# Patient Record
Sex: Female | Born: 1985 | Race: White | Hispanic: No | Marital: Single | Smoking: Current every day smoker
Health system: Southern US, Community
[De-identification: ages and names within clinical notes are randomized; demographics above are authoritative.]

## PROBLEM LIST (undated history)

## (undated) DIAGNOSIS — D891 Cryoglobulinemia: Secondary | ICD-10-CM

## (undated) DIAGNOSIS — F909 Attention-deficit hyperactivity disorder, unspecified type: Secondary | ICD-10-CM

## (undated) DIAGNOSIS — F32A Depression, unspecified: Secondary | ICD-10-CM

## (undated) DIAGNOSIS — J45909 Unspecified asthma, uncomplicated: Secondary | ICD-10-CM

## (undated) DIAGNOSIS — M069 Rheumatoid arthritis, unspecified: Secondary | ICD-10-CM

## (undated) DIAGNOSIS — F419 Anxiety disorder, unspecified: Secondary | ICD-10-CM

## (undated) HISTORY — DX: Depression, unspecified: F32.A

## (undated) HISTORY — DX: Unspecified asthma, uncomplicated: J45.909

## (undated) HISTORY — DX: Cryoglobulinemia: D89.1

## (undated) HISTORY — DX: Anxiety disorder, unspecified: F41.9

## (undated) HISTORY — DX: Rheumatoid arthritis, unspecified: M06.9

## (undated) HISTORY — DX: Attention-deficit hyperactivity disorder, unspecified type: F90.9

## (undated) HISTORY — PX: TUBAL LIGATION: SHX77

---

## 2020-08-21 ENCOUNTER — Other Ambulatory Visit: Payer: Self-pay

## 2020-08-21 DIAGNOSIS — N6320 Unspecified lump in the left breast, unspecified quadrant: Secondary | ICD-10-CM

## 2020-09-05 ENCOUNTER — Ambulatory Visit
Admission: RE | Admit: 2020-09-05 | Discharge: 2020-09-05 | Disposition: A | Discharge status: Home / Self Care | Payer: No Typology Code available for payment source | Source: Ambulatory Visit | Attending: Obstetrics and Gynecology | Admitting: Obstetrics and Gynecology

## 2020-09-05 ENCOUNTER — Ambulatory Visit: Payer: Medicaid Other | Admitting: *Deleted

## 2020-09-05 ENCOUNTER — Ambulatory Visit: Payer: Self-pay

## 2020-09-05 ENCOUNTER — Other Ambulatory Visit: Payer: Self-pay

## 2020-09-05 VITALS — BP 140/98 | Wt 200.1 lb

## 2020-09-05 DIAGNOSIS — Z1239 Encounter for other screening for malignant neoplasm of breast: Secondary | ICD-10-CM

## 2020-09-05 DIAGNOSIS — R87612 Low grade squamous intraepithelial lesion on cytologic smear of cervix (LGSIL): Secondary | ICD-10-CM

## 2020-09-05 DIAGNOSIS — N6320 Unspecified lump in the left breast, unspecified quadrant: Secondary | ICD-10-CM

## 2020-09-05 NOTE — Progress Notes (Signed)
Ms. Teffany Blaszczyk is a 34 y.o. female who presents to [facility] clinic today with complaint of left breast mass x 11 months that is painful. Patient states the pain comes and goes. Patient rates the pain at a 7 out of 10.    Pap Smear: Pap smear not completed today. Last Pap smear was 07/27/2020 at [facility] clinic in [place] and was LSIL with positive HPV. Patient referred for a colposcopy to follow-up for abnormal Pap smear. Patient stated she will have insurance after September 30, 2020. Patient stated she has an appointment scheduled for her colposcopy at [facility] in [place] on 10/25/2020 for follow-up. Per patient has history of three other abnormal Pap smears in 2004, 2005, and 2012 that no follow-up was completed. Last Pap smear result is available in Epic.   Physical exam: Breasts Breasts symmetrical. No skin abnormalities bilateral breasts. No nipple retraction bilateral breasts. No nipple discharge bilateral breasts. No lymphadenopathy. No lumps palpated right breast. Palpated a lump within the left breast at 1:30 o'clock 7 cm from the nipple. Complaints of pain when palpated left breast lump.   Pelvic/Bimanual Pap is not indicated today per BCCCP guidelines.   Smoking History: Patient is a current smoker. Discussed smoking cessation with patient. Referred to the [facility] Quitline and gave resources to the free smoking cessation classes at [facility].   Patient Navigation: Patient education provided. Access to services provided for patient through BCCCP program.    Breast and Cervical Cancer Risk Assessment: Patient has family history of maternal grandmother, a maternal aunt, and maternal great aunt having breast cancer. Patient stated he mom has recently had an abnormal mammogram that needs additional follow-up. Patient has no known genetic mutations or history of radiation treatment to the chest before age 3. Per patient has history of cervical dysplasia. Patient has  no history of being immunocompromised or DES exposure in-utero. Breast cancer risk assessment completed. No breast cancer risk calculated due to patient is less than 28 years old.  Risk Assessment    Risk Scores      09/05/2020   Last edited by: Narda Rutherford, LPN   5-year risk:    Lifetime risk:          A: BCCCP exam without pap smear Complaint of left breast lump and pain.  P: Referred patient to the [facility] for a diagnostic mammogram. Appointment scheduled Tuesday, September 05, 2020 at 1010.  Priscille Heidelberg, RN 09/05/2020 8:26 AM

## 2020-09-05 NOTE — Patient Instructions (Addendum)
Explained breast self awareness with Zaylynn Crigger. Patient did not need a Pap smear today due to last Pap smear was 07/27/2020. Explained the colposcopy the recommended follow-up for her abnormal Pap smear and the importance of following up. Patient referred for a colposcopy to follow-up for her abnormal Pap smear. Patient stated she will have insurance after September 30, 2020. Patient stated she has an appointment scheduled for her colposcopy at [facility] in [place] on 10/25/2020 for follow-up. Informed patient that if anything changes with her insurance that the colposcopy is covered by [facility] and to give [place] a call. Referred patient to the [facility] for a diagnostic mammogram. Appointment scheduled Tuesday, September 05, 2020 at 1010. Patient aware of appointments and will be there. Discussed smoking cessation with patient. Referred to the [facility] Quitline and gave resources to the free smoking cessation classes at [facility]. Chenel Wichert verbalized understanding.  Lucresha Dismuke, Kathaleen Maser, RN 8:26 AM

## 2020-10-03 ENCOUNTER — Ambulatory Visit
Admission: RE | Admit: 2020-10-03 | Discharge: 2020-10-03 | Disposition: A | Discharge status: Home / Self Care | Payer: 59 | Source: Ambulatory Visit | Attending: Obstetrics and Gynecology | Admitting: Obstetrics and Gynecology

## 2020-10-03 ENCOUNTER — Ambulatory Visit
Admission: RE | Admit: 2020-10-03 | Discharge: 2020-10-03 | Disposition: A | Discharge status: Home / Self Care | Payer: No Typology Code available for payment source | Source: Ambulatory Visit | Attending: Obstetrics and Gynecology | Admitting: Obstetrics and Gynecology

## 2020-10-03 ENCOUNTER — Other Ambulatory Visit: Payer: Self-pay

## 2020-10-03 ENCOUNTER — Other Ambulatory Visit: Payer: Self-pay | Admitting: Obstetrics and Gynecology

## 2020-10-03 DIAGNOSIS — N6320 Unspecified lump in the left breast, unspecified quadrant: Secondary | ICD-10-CM

## 2020-11-01 ENCOUNTER — Other Ambulatory Visit: Payer: Self-pay

## 2020-11-01 ENCOUNTER — Ambulatory Visit: Payer: No Typology Code available for payment source | Admitting: Nurse Practitioner

## 2020-11-01 ENCOUNTER — Ambulatory Visit (INDEPENDENT_AMBULATORY_CARE_PROVIDER_SITE_OTHER): Payer: 59 | Admitting: Nurse Practitioner

## 2020-11-01 ENCOUNTER — Encounter: Payer: Self-pay | Admitting: Nurse Practitioner

## 2020-11-01 VITALS — BP 158/94 | HR 88 | Temp 97.7°F | Ht 64.0 in | Wt 203.0 lb

## 2020-11-01 DIAGNOSIS — Z7689 Persons encountering health services in other specified circumstances: Secondary | ICD-10-CM

## 2020-11-01 DIAGNOSIS — F900 Attention-deficit hyperactivity disorder, predominantly inattentive type: Secondary | ICD-10-CM

## 2020-11-01 DIAGNOSIS — F418 Other specified anxiety disorders: Secondary | ICD-10-CM

## 2020-11-01 DIAGNOSIS — F17219 Nicotine dependence, cigarettes, with unspecified nicotine-induced disorders: Secondary | ICD-10-CM

## 2020-11-01 DIAGNOSIS — R5383 Other fatigue: Secondary | ICD-10-CM

## 2020-11-01 DIAGNOSIS — F419 Anxiety disorder, unspecified: Secondary | ICD-10-CM

## 2020-11-01 DIAGNOSIS — R030 Elevated blood-pressure reading, without diagnosis of hypertension: Secondary | ICD-10-CM

## 2020-11-01 DIAGNOSIS — J4520 Mild intermittent asthma, uncomplicated: Secondary | ICD-10-CM

## 2020-11-01 MED ORDER — HYDROXYZINE HCL 25 MG PO TABS: 25.0000 mg | ORAL_TABLET | Freq: Three times a day (TID) | ORAL | 0 refills | Status: DC | PRN

## 2020-11-01 NOTE — Progress Notes (Deleted)
New Patient Office Visit  Subjective:  Patient ID: Julia Humphrey, female    DOB: 1986/08/15  Age: 35 y.o. MRN: [id number]  CC:  Chief Complaint  Patient presents with  . New to establish    HPI Julia Humphrey presents for   Past Medical History:  Diagnosis Date  . ADHD   . Anxiety   . Asthma   . Depression   . Rheumatoid arthritis [facility])     Past Surgical History:  Procedure Laterality Date  . TUBAL LIGATION      Family History  Problem Relation Age of Onset  . Rheum arthritis Mother   . Hypertension Mother   . Brain cancer Father   . Hypertension Sister   . Breast cancer Maternal Grandmother     Social History   Socioeconomic History  . Marital status: Single    Spouse name: Not on file  . Number of children: 3  . Years of education: Not on file  . Highest education level: High school graduate  Occupational History  . Not on file  Tobacco Use  . Smoking status: Current Every Day Smoker    Packs/day: 0.50  . Smokeless tobacco: Never Used  Vaping Use  . Vaping Use: Never used  Substance and Sexual Activity  . Alcohol use: Yes    Comment: occasional  . 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  . Sexual activity: Not Currently    Birth control/protection: Pill  Other Topics Concern  . Not on file  Social History Narrative  . Not on file   Social Determinants of Health   Financial Resource Strain: Not on file  Food Insecurity: Not on file  Transportation Needs: No Transportation Needs  . Lack of Transportation (Medical): No  . Lack of Transportation (Non-Medical): No  Physical Activity: Not on file  Stress: Not on file  Social Connections: Not on file  Intimate Partner Violence: Not on file    ROS Review of Systems  Objective:   Today's Vitals: BP (!) 158/94 (BP Location: Right Arm, Patient Position: Sitting)   Pulse 88   Temp 97.7 F (36.5 C) (Temporal)   Ht 5\' 4"  (1.626  m)   Wt 203 lb (92.1 kg)   SpO2 100%   BMI 34.84 kg/m   Physical Exam  Assessment & Plan:   Problem List Items Addressed This Visit   None   Visit Diagnoses    Encounter to establish care with new doctor    -  Primary   Relevant Orders   CBC with Differential/Platelet   Comprehensive metabolic panel   TSH   Attention deficit hyperactivity disorder (ADHD), predominantly inattentive type       Cigarette nicotine dependence with nicotine-induced disorder       Fatigue, unspecified type       Relevant Orders   Vitamin D, 25-hydroxy      Outpatient Encounter Medications as of 11/01/2020  Medication Sig  . albuterol (VENTOLIN HFA) 108 (90 Base) MCG/ACT inhaler Inhale 1-2 puffs into the lungs every 6 (six) hours as needed for wheezing or shortness of breath.  . amphetamine-dextroamphetamine (ADDERALL) 30 MG tablet Take 30 mg by mouth 2 (two) times daily.  12/30/2020 escitalopram (LEXAPRO) 10 MG tablet Take 10 mg by mouth daily.  Marland Kitchen VITAMIN D PO Take by mouth.  . [DISCONTINUED] norethindrone (MICRONOR) 0.35 MG tablet Take 1 tablet by mouth daily.   No facility-administered encounter  medications on file as of 11/01/2020.    Follow-up: Return if symptoms worsen or fail to improve.   Janie Morning, NP

## 2020-11-01 NOTE — Progress Notes (Signed)
New Patient Office Visit  Subjective:  Patient ID: Julia Humphrey, female    DOB: Feb 27, 1986  Age: 35 y.o. MRN: [id number]  CC:  Chief Complaint  Patient presents with  . New to establish    HPI Julia Humphrey is a 35 year old Caucasian female that presents to establish care. She states she has a past medical history of asthma, ADHD, depression with anxiety, and Raynaud's syndrome. RA work-up negative per medical records review. She tells me her mother has RA. Her bp is elevated today, she denies past medical history of hypertension. Julia Humphrey tells me she has not had prescribed medications since moving from [place], [place] to [place] in Sept 2021. She has requested to resume previous medications. She declines COVID-19 or flu vaccines today. Julia Humphrey tells me that last pap smear was 2021.She states it was abnormal, required colposcopy. Julia Humphrey has not had an eye or dental exam in several years.   Asthma Julia Humphrey has a history of asthma since childhood. Current treatment is Albuterol inhaler PRN. She states her asthma symptoms are currently well-controlled. She denies dyspnea, chest tightness, or wheezing. She denies recent exacerbations or hospitalizations related to asthma. She is currently  a 1/2 pack daily cigarette smoker, since age 35.   ADHD Julia Humphrey has a history of ADHD since early adolescence. She tells me that her symptoms include inattentiveness, lacks organizational skills, forgetfulness, and difficulty managing time. She states last treatment of symptoms was Adderall 30 mg BID. Review of medical records confirms She has requested to resume prescription. 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     Depression with anxiety Julia Humphrey has a history of depression with anxiety. Last prescribed treatment was Lexapro 10 mg daily. She states she has not had medication since Sept  2021. She has requested to resume prescription. PHQ-9 score 18 and GAD-7 score 19 today in office. She denies suicidal ideations or thoughts of harming herself.    Past Medical History:  Diagnosis Date  . ADHD   . Anxiety   . Asthma   . Depression   . Rheumatoid arthritis [facility])     Past Surgical History:  Procedure Laterality Date  . TUBAL LIGATION      Family History  Problem Relation Age of Onset  . Rheum arthritis Mother   . Hypertension Mother   . Brain cancer Father   . Hypertension Sister   . Breast cancer Maternal Grandmother     Social History   Socioeconomic History  . Marital status: Single    Spouse name: Not on file  . Number of children: 3  . Years of education: Not on file  . Highest education level: High school graduate  Occupational History  . Not on file  Tobacco Use  . Smoking status: Current Every Day Smoker    Packs/day: 0.50  . Smokeless tobacco: Never Used  Vaping Use  . Vaping Use: Never used  Substance and Sexual Activity  . Alcohol use: Yes    Comment: occasional  . 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  . Sexual activity: Not Currently    Birth control/protection: Pill  Other Topics Concern  . Not on file  Social History Narrative  . Not on file   Social Determinants of Health   Financial Resource Strain: Not on file  Food Insecurity: Not on file  Transportation Needs: No Transportation Needs  . Lack of Transportation (Medical): No  . Lack of Transportation (Non-Medical): No  Physical Activity: Not on file  Stress: Not on file  Social Connections: Not on file  Intimate Partner Violence: Not on file    ROS Review of Systems  Constitutional: Negative for fatigue and fever.  HENT: Negative for congestion, ear pain, sinus pressure and sore throat.   Eyes: Negative for pain.  Respiratory: Negative for cough, chest tightness, shortness of breath and wheezing.    Cardiovascular: Negative for chest pain and palpitations.  Gastrointestinal: Negative for abdominal pain, constipation, diarrhea, nausea and vomiting.  Genitourinary: Negative for dysuria and hematuria.  Musculoskeletal: Negative for arthralgias, back pain, joint swelling and myalgias.  Skin: Negative for rash.  Neurological: Negative for dizziness, weakness and headaches.  Psychiatric/Behavioral: Negative for dysphoric mood. The patient is not nervous/anxious.     Objective:   Today's Vitals: BP (!) 158/94 (BP Location: Right Arm, Patient Position: Sitting)   Pulse 88   Temp 97.7 F (36.5 C) (Temporal)   Ht 5\' 4"  (1.626 m)   Wt 203 lb (92.1 kg)   SpO2 100%   BMI 34.84 kg/m   Physical Exam Constitutional:      Appearance: Normal appearance.  HENT:     Right Ear: Tympanic membrane, ear canal and external ear normal.     Left Ear: Tympanic membrane, ear canal and external ear normal.     Nose: Nose normal.     Mouth/Throat:     Mouth: Mucous membranes are moist.  Cardiovascular:     Rate and Rhythm: Normal rate and regular rhythm.     Pulses: Normal pulses.     Heart sounds: Normal heart sounds.  Pulmonary:     Effort: Pulmonary effort is normal.     Breath sounds: Normal breath sounds.  Abdominal:     Palpations: Abdomen is soft.  Musculoskeletal:        General: Normal range of motion.     Cervical back: Normal range of motion.  Skin:    General: Skin is warm and dry.  Neurological:     Mental Status: She is oriented to person, place, and time.  Psychiatric:        Mood and Affect: Mood normal.        Behavior: Behavior normal.        Thought Content: Thought content normal.        Judgment: Judgment normal.     Assessment & Plan:   1. Encounter to establish care with new doctor - CBC with Differential/Platelet - Comprehensive metabolic panel - TSH  2. Attention deficit hyperactivity disorder (ADHD), predominantly inattentive type -  amphetamine-dextroamphetamine (ADDERALL) 30 MG tablet; Take 1 tablet by mouth 2 (two) times daily.  Dispense: 60 tablet; Refill: 0  3. Cigarette nicotine dependence with nicotine-induced disorder -Smoking cessation encouraged  4. Fatigue, unspecified type - Vitamin D, 25-hydroxy  5. Elevated BP without diagnosis of hypertension -Smoking cessation encouraged -Return in 2 weeks for BP recheck  6. Depression with anxiety-Not currently well controlled -PHQ-9 score 18 in office today -GAD 7 Score 19 in office today - hydrOXYzine (ATARAX/VISTARIL) 25 MG tablet; Take 1 tablet (25 mg total) by mouth 3 (three) times daily as needed.  Dispense: 30 tablet; Refill: 0 - Resume Lexapro 10 mg daily  7. Mild intermittent asthma, unspecified whether complicated -Albuterol inhaler as needed -Smoking cessation encouraged  Outpatient Encounter Medications as of 11/01/2020  Medication Sig  .  albuterol (VENTOLIN HFA) 108 (90 Base) MCG/ACT inhaler Inhale 1-2 puffs into the lungs every 6 (six) hours as needed for wheezing or shortness of breath.  . amphetamine-dextroamphetamine (ADDERALL) 30 MG tablet Take 30 mg by mouth 2 (two) times daily.  Marland Kitchen escitalopram (LEXAPRO) 10 MG tablet Take 10 mg by mouth daily.  Marland Kitchen VITAMIN D PO Take by mouth.  . [DISCONTINUED] norethindrone (MICRONOR) 0.35 MG tablet Take 1 tablet by mouth daily.   No facility-administered encounter medications on file as of 11/01/2020.   We will call you with lab results Resume Atarax 25 mg as needed for anxiety Resume Lexapro 10 mg  Return in 2-weeks for BP check and pap smear    Follow-up: 2-weeks for BP recheck  Flonnie Hailstone, DNP

## 2020-11-01 NOTE — Patient Instructions (Addendum)
We will call you with lab results Resume Atarax 25 mg as needed for anxiety Resume Lexapro 10 mg  Return in 2-weeks for BP check and pap smear     [email or web address]  Generalized Anxiety Disorder, Adult Generalized anxiety disorder (GAD) is a mental health condition. Unlike normal worries, anxiety related to GAD is not triggered by a specific event. These worries do not fade or get better with time. GAD interferes with relationships, work, and school. GAD symptoms can vary from mild to severe. People with severe GAD can have intense waves of anxiety with physical symptoms that are similar to panic attacks. What are the causes? The exact cause of GAD is not known, but the following are believed to have an impact:  Differences in natural brain chemicals.  Genes passed down from parents to children.  Differences in the way threats are perceived.  Development during childhood.  Personality. What increases the risk? The following factors may make you more likely to develop this condition:  Being female.  Having a family history of anxiety disorders.  Being very shy.  Experiencing very stressful life events, such as the death of a loved one.  Having a very stressful family environment. What are the signs or symptoms? People with GAD often worry excessively about many things in their lives, such as their health and family. Symptoms may also include:  Mental and emotional symptoms: ? Worrying excessively about natural disasters. ? Fear of being late. ? Difficulty concentrating. ? Fears that others are judging your performance.  Physical symptoms: ? Fatigue. ? Headaches, muscle tension, muscle twitches, trembling, or feeling shaky. ? Feeling like your heart is pounding or beating very fast. ? Feeling out of breath or like you cannot take a deep breath. ? Having trouble falling asleep or staying asleep, or experiencing restlessness. ? Sweating. ? Nausea, diarrhea, or  irritable bowel syndrome (IBS).  Behavioral symptoms: ? Experiencing erratic moods or irritability. ? Avoidance of new situations. ? Avoidance of people. ? Extreme difficulty making decisions. How is this diagnosed? This condition is diagnosed based on your symptoms and medical history. You will also have a physical exam. Your health care provider may perform tests to rule out other possible causes of your symptoms. To be diagnosed with GAD, a person must have anxiety that:  Is out of his or her control.  Affects several different aspects of his or her life, such as work and relationships.  Causes distress that makes him or her unable to take part in normal activities.  Includes at least three symptoms of GAD, such as restlessness, fatigue, trouble concentrating, irritability, muscle tension, or sleep problems. Before your health care provider can confirm a diagnosis of GAD, these symptoms must be present more days than they are not, and they must last for 6 months or longer. How is this treated? This condition may be treated with:  Medicine. Antidepressant medicine is usually prescribed for long-term daily control. Anti-anxiety medicines may be added in severe cases, especially when panic attacks occur.  Talk therapy (psychotherapy). Certain types of talk therapy can be helpful in treating GAD by providing support, education, and guidance. Options include: ? Cognitive behavioral therapy (CBT). People learn coping skills and self-calming techniques to ease their physical symptoms. They learn to identify unrealistic thoughts and behaviors and to replace them with more appropriate thoughts and behaviors. ? Acceptance and commitment therapy (ACT). This treatment teaches people how to be mindful as a way to cope with unwanted thoughts and feelings. ?  Biofeedback. This process trains you to manage your body's response (physiological response) through breathing techniques and relaxation methods.  You will work with a therapist while machines are used to monitor your physical symptoms.  Stress management techniques. These include yoga, meditation, and exercise. A mental health specialist can help determine which treatment is best for you. Some people see improvement with one type of therapy. However, other people require a combination of therapies.   Follow these instructions at home: Lifestyle  Maintain a consistent routine and schedule.  Anticipate stressful situations. Create a plan, and allow extra time to work with your plan.  Practice stress management or self-calming techniques that you have learned from your therapist or your health care provider. General instructions  Take over-the-counter and prescription medicines only as told by your health care provider.  Understand that you are likely to have setbacks. Accept this and be kind to yourself as you persist to take better care of yourself.  Recognize and accept your accomplishments, even if you judge them as small.  Keep all follow-up visits as told by your health care provider. This is important. Contact a health care provider if:  Your symptoms do not get better.  Your symptoms get worse.  You have signs of depression, such as: ? A persistently sad or irritable mood. ? Loss of enjoyment in activities that used to bring you joy. ? Change in weight or eating. ? Changes in sleeping habits. ? Avoiding friends or family members. ? Loss of energy for normal tasks. ? Feelings of guilt or worthlessness. Get help right away if:  You have serious thoughts about hurting yourself or others. If you ever feel like you may hurt yourself or others, or have thoughts about taking your own life, get help right away. Go to your nearest emergency department or:  Call your local emergency services (911 in the [place]  Call a suicide crisis helpline, such as the [facility] at 229-267-7914. This is open  24 hours a day in the [place]  Text the Crisis Text Line at 5872014779 (in the [place].). Summary  Generalized anxiety disorder (GAD) is a mental health condition that involves worry that is not triggered by a specific event.  People with GAD often worry excessively about many things in their lives, such as their health and family.  GAD may cause symptoms such as restlessness, trouble concentrating, sleep problems, frequent sweating, nausea, diarrhea, headaches, and trembling or muscle twitching.  A mental health specialist can help determine which treatment is best for you. Some people see improvement with one type of therapy. However, other people require a combination of therapies. This information is not intended to replace advice given to you by your health care provider. Make sure you discuss any questions you have with your health care provider. Document Revised: 07/07/2019 Document Reviewed: 07/07/2019 Elsevier Patient Education  [place] 76-22 Years Old, Female Preventive care refers to lifestyle choices and visits with your health care provider that can promote health and wellness. This includes:  A yearly physical exam. This is also called an annual wellness visit.  Regular dental and eye exams.  Immunizations.  Screening for certain conditions.  Healthy lifestyle choices, such as: ? Eating a healthy diet. ? Getting regular exercise. ? Not using drugs or products that contain nicotine and tobacco. ? Limiting alcohol use. What can I expect for my preventive care visit? Physical exam Your health care provider may check your:  Height and weight. These may be used to calculate your BMI (body mass index). BMI is a measurement that tells if you are at a healthy weight.  Heart rate and blood pressure.  Body temperature.  Skin for abnormal spots. Counseling Your health care provider may ask you questions about your:  Past medical  problems.  Family's medical history.  Alcohol, tobacco, and drug use.  Emotional well-being.  Home life and relationship well-being.  Sexual activity.  Diet, exercise, and sleep habits.  Work and work Statistician.  Access to firearms.  Method of birth control.  Menstrual cycle.  Pregnancy history. What immunizations do I need? Vaccines are usually given at various ages, according to a schedule. Your health care provider will recommend vaccines for you based on your age, medical history, and lifestyle or other factors, such as travel or where you work.   What tests do I need? Blood tests  Lipid and cholesterol levels. These may be checked every 5 years starting at age 63.  Hepatitis C test.  Hepatitis B test. Screening  Diabetes screening. This is done by checking your blood sugar (glucose) after you have not eaten for a while (fasting).  STD (sexually transmitted disease) testing, if you are at risk.  BRCA-related cancer screening. This may be done if you have a family history of breast, ovarian, tubal, or peritoneal cancers.  Pelvic exam and Pap test. This may be done every 3 years starting at age 77. Starting at age 51, this may be done every 5 years if you have a Pap test in combination with an HPV test. Talk with your health care provider about your test results, treatment options, and if necessary, the need for more tests.   Follow these instructions at home: Eating and drinking  Eat a healthy diet that includes fresh fruits and vegetables, whole grains, lean protein, and low-fat dairy products.  Take vitamin and mineral supplements as recommended by your health care provider.  Do not drink alcohol if: ? Your health care provider tells you not to drink. ? You are pregnant, may be pregnant, or are planning to become pregnant.  If you drink alcohol: ? Limit how much you have to 0-1 drink a day. ? Be aware of how much alcohol is in your drink. In the [place]  one drink equals one 12 oz bottle of beer (355 mL), one 5 oz glass of wine (148 mL), or one 1 oz glass of hard liquor (44 mL).   Lifestyle  Take daily care of your teeth and gums. Brush your teeth every morning and night with fluoride toothpaste. Floss one time each day.  Stay active. Exercise for at least 30 minutes 5 or more days each week.  Do not use any products that contain nicotine or tobacco, such as cigarettes, e-cigarettes, and chewing tobacco. If you need help quitting, ask your health care provider.  Do not use drugs.  If you are sexually active, practice safe sex. Use a condom or other form of protection to prevent STIs (sexually transmitted infections).  If you do not wish to become pregnant, use a form of birth control. If you plan to become pregnant, see your health care provider for a prepregnancy visit.  Find healthy ways to cope with stress, such as: ? Meditation, yoga, or listening to music. ? Journaling. ? Talking to a trusted person. ? Spending time with friends and family. Safety  Always wear your seat belt while driving or riding in a vehicle.  Do not drive: ? If you have been drinking alcohol. Do not ride with someone who has been drinking. ? When you are tired or distracted. ? While texting.  Wear a helmet and other protective equipment during sports activities.  If you have firearms in your house, make sure you follow all gun safety procedures.  Seek help if you have been physically or sexually abused. What's next?  Go to your health care provider once a year for an annual wellness visit.  Ask your health care provider how often you should have your eyes and teeth checked.  Stay up to date on all vaccines. This information is not intended to replace advice given to you by your health care provider. Make sure you discuss any questions you have with your health care provider. Document Revised: 05/14/2020 Document Reviewed: 05/28/2018 Elsevier  Patient Education  2021 [place].  Major Depressive Disorder, Adult Major depressive disorder is a mental health condition. This disorder affects feelings. It can also affect the body. Symptoms of this condition last most of the day, almost every day, for 2 weeks. This disorder can affect:  Relationships.  Daily activities, such as work and school.  Activities that you normally like to do. What are the causes? The cause of this condition is not known. The disorder is likely caused by a mix of things, including:  Your personality, such as being a shy person.  Your behavior, or how you act toward others.  Your thoughts and feelings.  Too much alcohol or drugs.  How you react to stress.  Health and mental problems that you have had for a long time.  Things that hurt you in the past (trauma).  Big changes in your life, such as divorce. What increases the risk? The following factors may make you more likely to develop this condition:  Having family members with depression.  Being a woman.  Problems in the family.  Low levels of some brain chemicals.  Things that caused you pain as a child, especially if you lost a parent or were abused.  A lot of stress in your life, such as from: ? Living without basic needs of life, such as food and shelter. ? Being treated poorly because of race, sex, or religion (discrimination).  Health and mental problems that you have had for a long time. What are the signs or symptoms? The main symptoms of this condition are:  Being sad all the time.  Being grouchy all the time.  Loss of interest in things and activities. Other symptoms include:  Sleeping too much or too little.  Eating too much or too little.  Gaining or losing weight, without knowing why.  Feeling tired or having low energy.  Being restless and weak.  Feeling hopeless, worthless, or guilty.  Trouble thinking clearly or making decisions.  Thoughts of  hurting yourself or others, or thoughts of ending your life.  Spending a lot of time alone.  Inability to complete common tasks of daily life. If you have very bad MDD, you may:  Believe things that are not true.  Hear, see, taste, or feel things that are not there.  Have mild depression that lasts for at least 2 years.  Feel very sad and hopeless.  Have trouble speaking or moving. How is this treated? This condition may be treated with:  Talk therapy. This teaches you to know bad thoughts, feelings, and actions and how to change them. ? This can also help you to  communicate with others. ? This can be done with members of your family.  Medicines. These can be used to treat worry (anxiety), depression, or low levels of chemicals in the brain.  Lifestyle changes. You may need to: ? Limit alcohol use. ? Limit drug use. ? Get regular exercise. ? Get plenty of sleep. ? Make healthy eating choices. ? Spend more time outdoors.  Brain stimulation. This treatment excites the brain. This is done when symptoms are very bad or have not gotten better with other treatments. Follow these instructions at home: Activity  Get regular exercise as told.  Spend time outdoors as told.  Make time to do the things you enjoy.  Find ways to deal with stress. Try to: ? Meditate. ? Do deep breathing. ? Spend time in nature. ? Keep a journal.  Return to your normal activities as told by your doctor. Ask your doctor what activities are safe for you. Alcohol and drug use  If you drink alcohol: ? Limit how much you use to:  0-1 drink a day for women.  0-2 drinks a day for men. ? Be aware of how much alcohol is in your drink. In the [place] one drink equals one 12 oz bottle of beer (355 mL), one 5 oz glass of wine (148 mL), or one 1 oz glass of hard liquor (44 mL).  Talk to your doctor about: ? Alcohol use. Alcohol can affect some medicines. ? Any drug use. General instructions  Take  over-the-counter and prescription medicines and herbal preparations only as told by your doctor.  Eat a healthy diet.  Get a lot of sleep.  Think about joining a support group. Your doctor may be able to suggest one.  Keep all follow-up visits as told by your doctor. This is important.   Where to find more information:  [employer] on Mental Illness: www.nami.[place]: https://carter.com/  American Psychiatric Association: www.psychiatry.org/patients-families/ Contact a doctor if:  Your symptoms get worse.  You get new symptoms. Get help right away if:  You hurt yourself.  You have serious thoughts about hurting yourself or others.  You see, hear, taste, smell, or feel things that are not there. If you ever feel like you may hurt yourself or others, or have thoughts about taking your own life, get help right away. Go to your nearest emergency department or:  Call your local emergency services (911 in the [place]  Call a suicide crisis helpline, such as the [facility] at (272)568-2948. This is open 24 hours a day in the [place]  Text the Crisis Text Line at 2130447678 (in the [place].). Summary  Major depressive disorder is a mental health condition. This disorder affects feelings. Symptoms of this condition last most of the day, almost every day, for 2 weeks.  The symptoms of this disorder can cause problems with relationships and with daily activities.  There are treatments and support for people who get this disorder. You may need more than one type of treatment.  Get help right away if you have serious thoughts about hurting yourself or others. This information is not intended to replace advice given to you by your health care provider. Make sure you discuss any questions you have with your health care provider. Document Revised: 08/28/2019 Document Reviewed: 08/28/2019 Elsevier Patient Education  2021 [employer].

## 2020-11-02 LAB — COMPREHENSIVE METABOLIC PANEL
ALT: 21 IU/L (ref 0–32)
AST: 19 IU/L (ref 0–40)
Albumin/Globulin Ratio: 1.8 (ref 1.2–2.2)
Albumin: 4.4 g/dL (ref 3.8–4.8)
Alkaline Phosphatase: 58 IU/L (ref 44–121)
BUN/Creatinine Ratio: 23 (ref 9–23)
BUN: 13 mg/dL (ref 6–20)
Bilirubin Total: <0.2 mg/dL (ref 0.0–1.2)
CO2: 24 mmol/L (ref 20–29)
Calcium: 8.5 mg/dL — ABNORMAL LOW (ref 8.7–10.2)
Chloride: 101 mmol/L (ref 96–106)
Creatinine, Ser: 0.57 mg/dL (ref 0.57–1.00)
GFR calc Af Amer: 140 mL/min/{1.73_m2} (ref >59)
GFR calc non Af Amer: 121 mL/min/{1.73_m2} (ref >59)
Globulin, Total: 2.4 g/dL (ref 1.5–4.5)
Glucose: 82 mg/dL (ref 65–99)
Potassium: 4.5 mmol/L (ref 3.5–5.2)
Sodium: 138 mmol/L (ref 134–144)
Total Protein: 6.8 g/dL (ref 6.0–8.5)

## 2020-11-02 LAB — CBC WITH DIFFERENTIAL/PLATELET
Basophils Absolute: 0.1 10*3/uL (ref 0.0–0.2)
Basos: 1 %
EOS (ABSOLUTE): 0.2 10*3/uL (ref 0.0–0.4)
Eos: 3 %
Hematocrit: 40.7 % (ref 34.0–46.6)
Hemoglobin: 13.9 g/dL (ref 11.1–15.9)
Immature Grans (Abs): 0 10*3/uL (ref 0.0–0.1)
Immature Granulocytes: 0 %
Lymphocytes Absolute: 1.8 10*3/uL (ref 0.7–3.1)
Lymphs: 24 %
MCH: 32.4 pg (ref 26.6–33.0)
MCHC: 34.2 g/dL (ref 31.5–35.7)
MCV: 95 fL (ref 79–97)
Monocytes Absolute: 0.5 10*3/uL (ref 0.1–0.9)
Monocytes: 6 %
Neutrophils Absolute: 5 10*3/uL (ref 1.4–7.0)
Neutrophils: 66 %
Platelets: 276 10*3/uL (ref 150–450)
RBC: 4.29 x10E6/uL (ref 3.77–5.28)
RDW: 12.5 % (ref 11.7–15.4)
WBC: 7.5 10*3/uL (ref 3.4–10.8)

## 2020-11-02 LAB — VITAMIN D 25 HYDROXY (VIT D DEFICIENCY, FRACTURES): Vit D, 25-Hydroxy: 29 ng/mL — ABNORMAL LOW (ref 30.0–100.0)

## 2020-11-02 LAB — TSH: TSH: 2.09 u[IU]/mL (ref 0.450–4.500)

## 2020-11-02 MED ORDER — AMPHETAMINE-DEXTROAMPHETAMINE 30 MG PO TABS: 30.0000 mg | ORAL_TABLET | Freq: Two times a day (BID) | ORAL | 0 refills | Status: DC

## 2020-11-04 ENCOUNTER — Other Ambulatory Visit: Payer: Self-pay | Admitting: Nurse Practitioner

## 2020-11-04 DIAGNOSIS — F418 Other specified anxiety disorders: Secondary | ICD-10-CM

## 2020-11-04 MED ORDER — ESCITALOPRAM OXALATE 10 MG PO TABS: 10.0000 mg | ORAL_TABLET | Freq: Every day | ORAL | 0 refills | Status: DC

## 2020-11-05 ENCOUNTER — Encounter: Payer: Self-pay | Admitting: Nurse Practitioner

## 2020-11-15 ENCOUNTER — Ambulatory Visit (INDEPENDENT_AMBULATORY_CARE_PROVIDER_SITE_OTHER): Payer: 59

## 2020-11-15 ENCOUNTER — Other Ambulatory Visit: Payer: Self-pay

## 2020-11-15 VITALS — BP 120/78 | HR 82

## 2020-11-15 DIAGNOSIS — R030 Elevated blood-pressure reading, without diagnosis of hypertension: Secondary | ICD-10-CM | POA: Diagnosis not present

## 2020-11-15 NOTE — Progress Notes (Signed)
      Patient came in today for BP check.  Vitals with BMI 11/15/2020 11/01/2020  Systolic 120 158  Diastolic 78 94  Pulse 82 88   Patient states that she is taking all medications as directed.  Creola Corn, LPN 07/86/75 4:49 AM

## 2020-12-01 ENCOUNTER — Other Ambulatory Visit: Payer: Self-pay

## 2020-12-01 DIAGNOSIS — F900 Attention-deficit hyperactivity disorder, predominantly inattentive type: Secondary | ICD-10-CM

## 2020-12-02 MED ORDER — AMPHETAMINE-DEXTROAMPHETAMINE 30 MG PO TABS: 30.0000 mg | ORAL_TABLET | Freq: Two times a day (BID) | ORAL | 0 refills | Status: DC

## 2021-01-02 ENCOUNTER — Other Ambulatory Visit: Payer: Self-pay

## 2021-01-02 DIAGNOSIS — F900 Attention-deficit hyperactivity disorder, predominantly inattentive type: Secondary | ICD-10-CM

## 2021-01-02 DIAGNOSIS — F418 Other specified anxiety disorders: Secondary | ICD-10-CM

## 2021-01-03 MED ORDER — ESCITALOPRAM OXALATE 10 MG PO TABS: 10.0000 mg | ORAL_TABLET | Freq: Every day | ORAL | 0 refills | Status: DC

## 2021-01-03 MED ORDER — HYDROXYZINE HCL 25 MG PO TABS: 25.0000 mg | ORAL_TABLET | Freq: Three times a day (TID) | ORAL | 0 refills | Status: DC | PRN

## 2021-01-03 MED ORDER — AMPHETAMINE-DEXTROAMPHETAMINE 30 MG PO TABS: 30.0000 mg | ORAL_TABLET | Freq: Two times a day (BID) | ORAL | 0 refills | Status: DC

## 2021-01-03 NOTE — Telephone Encounter (Signed)
Needs to switch pharmacy.   Julia Humphrey, CCMA 01/03/21 10:20 AM

## 2021-01-03 NOTE — Addendum Note (Signed)
Addended by: Humberto Leep on: 01/03/2021 10:21 AM   Modules accepted: Orders

## 2021-02-01 ENCOUNTER — Other Ambulatory Visit: Payer: Self-pay

## 2021-02-01 DIAGNOSIS — F900 Attention-deficit hyperactivity disorder, predominantly inattentive type: Secondary | ICD-10-CM

## 2021-02-01 DIAGNOSIS — F418 Other specified anxiety disorders: Secondary | ICD-10-CM

## 2021-02-01 MED ORDER — ESCITALOPRAM OXALATE 10 MG PO TABS: 10.0000 mg | ORAL_TABLET | Freq: Every day | ORAL | 0 refills | Status: DC

## 2021-02-01 MED ORDER — HYDROXYZINE HCL 25 MG PO TABS: 25.0000 mg | ORAL_TABLET | Freq: Three times a day (TID) | ORAL | 0 refills | Status: DC | PRN

## 2021-02-05 ENCOUNTER — Other Ambulatory Visit: Payer: Self-pay

## 2021-02-05 ENCOUNTER — Other Ambulatory Visit: Payer: Self-pay | Admitting: Nurse Practitioner

## 2021-02-05 DIAGNOSIS — F418 Other specified anxiety disorders: Secondary | ICD-10-CM

## 2021-02-05 DIAGNOSIS — F900 Attention-deficit hyperactivity disorder, predominantly inattentive type: Secondary | ICD-10-CM

## 2021-02-05 MED ORDER — ESCITALOPRAM OXALATE 10 MG PO TABS: 10.0000 mg | ORAL_TABLET | Freq: Every day | ORAL | 0 refills | Status: DC

## 2021-02-05 MED ORDER — HYDROXYZINE HCL 25 MG PO TABS: 25.0000 mg | ORAL_TABLET | Freq: Three times a day (TID) | ORAL | 0 refills | Status: DC | PRN

## 2021-02-05 NOTE — Telephone Encounter (Signed)
Pt requesting switch in pharmacy.   Lorita Officer, [place] 02/05/21 9:18 AM

## 2021-02-05 NOTE — Telephone Encounter (Signed)
Yes, pended correct pharmacy.   Lorita Officer, CCMA 02/05/21 9:35 AM

## 2021-02-05 NOTE — Telephone Encounter (Signed)
To [facility] in [place]?

## 2021-03-06 ENCOUNTER — Telehealth: Payer: Self-pay

## 2021-03-06 DIAGNOSIS — F900 Attention-deficit hyperactivity disorder, predominantly inattentive type: Secondary | ICD-10-CM

## 2021-03-09 ENCOUNTER — Other Ambulatory Visit: Payer: Self-pay | Admitting: Nurse Practitioner

## 2021-03-09 DIAGNOSIS — F418 Other specified anxiety disorders: Secondary | ICD-10-CM

## 2021-03-09 DIAGNOSIS — F900 Attention-deficit hyperactivity disorder, predominantly inattentive type: Secondary | ICD-10-CM

## 2021-03-09 MED ORDER — HYDROXYZINE HCL 25 MG PO TABS: 25.0000 mg | ORAL_TABLET | Freq: Three times a day (TID) | ORAL | 0 refills | Status: DC | PRN

## 2021-03-09 MED ORDER — AMPHETAMINE-DEXTROAMPHETAMINE 30 MG PO TABS: 30.0000 mg | ORAL_TABLET | Freq: Two times a day (BID) | ORAL | 0 refills | Status: DC

## 2021-03-09 MED ORDER — ESCITALOPRAM OXALATE 10 MG PO TABS: 10.0000 mg | ORAL_TABLET | Freq: Every day | ORAL | 0 refills | Status: DC

## 2021-03-09 NOTE — Telephone Encounter (Signed)
I spoke with Damon regarding the pts balance. Per Damon, he sent the information back to [facility]. He is going to follow up with them on it. Waiting for Damon to get back with me about the balance.

## 2021-03-09 NOTE — Telephone Encounter (Signed)
Pt called back this morning stating that her refill request was not sent in. I spoke with Carollee Herter and her nurse. The rx was denied due to the pt being due for an appointment. Carollee Herter was notified that she is going out of town and will not be back until the week of June 20. Pt was notified that she has to be seen every 3 months unless told otherwise to get a refill on her medication. Also, she was notified that she has to be seen every 3 months due to her high bp. Pt has requested that the refill be sent to [facility] on  [address], [place] due to it being closer to her work. Pt was notified of her balance and they will ask for her to pay that when she checks in. Pt then stated she spoke with a man at our office about the balance and he was going to fix it because her insurance covers the first pcp visit. I told the pt that I would f.up with Damon on it and I or him will call her back.

## 2021-03-20 NOTE — Progress Notes (Signed)
Subjective:  Patient ID: Julia Humphrey, female    DOB: March 29, 1986  Age: 35 y.o. MRN: [id number]  Chief Complaint  Patient presents with   ADHD     HPI  Julia Humphrey is a 35 year old Caucasian female that presents for follow-up of adult ADHD, depression with anxiety, and vitamin D deficiency. She tells me she has adult asthma and has been using albuterol inhaler more due to increased chest tightness,wheezing, and cough. She states she has had an in-home strep pharyngitis exposure with her young daughter.   Adult ADHD Julia Humphrey has a past history of adult ADHD for several years. Current treatment is Adderall 30 mg twice daily. States symptoms are well-controlled. Denies side effects of medication.  Depression with anxiety Julia Humphrey has a past medical history of depression with anxiety. Current treatment is Zoloft 10 mg daily. States symptoms are well-controlled.     Current Outpatient Medications on File Prior to Visit  Medication Sig Dispense Refill   albuterol (VENTOLIN HFA) 108 (90 Base) MCG/ACT inhaler Inhale 1-2 puffs into the lungs every 6 (six) hours as needed for wheezing or shortness of breath.     amphetamine-dextroamphetamine (ADDERALL) 30 MG tablet Take 1 tablet by mouth 2 (two) times daily. 60 tablet 0   escitalopram (LEXAPRO) 10 MG tablet Take 1 tablet (10 mg total) by mouth daily. 90 tablet 0   hydrOXYzine (ATARAX/VISTARIL) 25 MG tablet Take 1 tablet (25 mg total) by mouth 3 (three) times daily as needed. 270 tablet 0   VITAMIN D PO Take by mouth.     No current facility-administered medications on file prior to visit.   Past Medical History:  Diagnosis Date   ADHD    Anxiety    Asthma    Depression    Rheumatoid arthritis (HCC)    Past Surgical History:  Procedure Laterality Date   TUBAL LIGATION      Family History  Problem Relation Age of Onset   Rheum arthritis Mother    Hypertension Mother    Brain cancer Father    Hypertension Sister    Breast  cancer Maternal Grandmother    Social History   Socioeconomic History   Marital status: Single    Spouse name: Not on file   Number of children: 3   Years of education: Not on file   Highest education level: High school graduate  Occupational History   Not on file  Tobacco Use   Smoking status: Every Day    Packs/day: 0.50    Pack years: 0.00    Types: Cigarettes   Smokeless tobacco: Never   Tobacco comments:    began smoking at age 52  Vaping Use   Vaping Use: Some days  Substance and Sexual Activity   Alcohol use: Yes    Comment: occasional   Drug use: Not Currently    Comment: 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   Sexual activity: Not Currently    Birth control/protection: Pill  Other Topics Concern   Not on file  Social History Narrative   Not on file   Social Determinants of Health   Financial Resource Strain: Not on file  Food Insecurity: Not on file  Transportation Needs: No Transportation Needs   Lack of Transportation (Medical): No   Lack of Transportation (Non-Medical): No  Physical Activity: Not on file  Stress: Not on file  Social Connections: Not on file    Review of Systems  Constitutional:  Negative for chills, fatigue and fever.  HENT:  Negative for congestion, ear pain, rhinorrhea and sore throat.   Eyes: Negative.   Respiratory:  Positive for cough, chest tightness and wheezing. Negative for shortness of breath.   Cardiovascular:  Negative for chest pain.  Gastrointestinal:  Negative for abdominal pain, constipation, diarrhea, nausea and vomiting.  Endocrine: Negative.   Genitourinary:  Negative for dysuria and urgency.  Musculoskeletal:  Negative for back pain and myalgias.  Skin:        Mottled skin to bilateral lower extremities  Allergic/Immunologic: Positive for food allergies.  Neurological:  Negative for dizziness, weakness, light-headedness and headaches.  Psychiatric/Behavioral:  Positive for decreased  concentration. Negative for dysphoric mood. The patient is not nervous/anxious.     Objective:  BP 120/74   Pulse 89   Temp (!) 97.2 F (36.2 C)   Ht 5\' 4"  (1.626 m)   Wt 183 lb (83 kg)   SpO2 99%   BMI 31.41 kg/m    BP/Weight 11/15/2020 11/01/2020 09/05/2020  Systolic BP 120 158 140  Diastolic BP 78 94 98  Wt. (Lbs) - 203 200.1  BMI - 34.84 -    Physical Exam Vitals reviewed.  Constitutional:      Appearance: Normal appearance.  HENT:     Right Ear: Tympanic membrane normal.     Left Ear: Tympanic membrane normal.     Mouth/Throat:     Mouth: Mucous membranes are dry.     Pharynx: Posterior oropharyngeal erythema present.  Cardiovascular:     Rate and Rhythm: Normal rate and regular rhythm.     Pulses: Normal pulses.     Heart sounds: Normal heart sounds.  Pulmonary:     Effort: Pulmonary effort is normal.     Breath sounds: Wheezing (inspiratory wheezing to posterior left upper lobe) present.  Abdominal:     General: Bowel sounds are normal.     Palpations: Abdomen is soft.  Skin:    General: Skin is warm and dry.     Capillary Refill: Capillary refill takes less than 2 seconds.  Neurological:     General: No focal deficit present.     Mental Status: She is alert and oriented to person, place, and time.  Psychiatric:        Mood and Affect: Mood normal.        Behavior: Behavior normal.     Lab Results  Component Value Date   WBC 7.5 11/01/2020   HGB 13.9 11/01/2020   HCT 40.7 11/01/2020   PLT 276 11/01/2020   GLUCOSE 82 11/01/2020   ALT 21 11/01/2020   AST 19 11/01/2020   NA 138 11/01/2020   K 4.5 11/01/2020   CL 101 11/01/2020   CREATININE 0.57 11/01/2020   BUN 13 11/01/2020   CO2 24 11/01/2020   TSH 2.090 11/01/2020      Assessment & Plan:    1. Attention deficit hyperactivity disorder (ADHD), predominantly inattentive type - Comprehensive metabolic panel -Continue Adderall 30 mg twice daily   2. Depression with anxiety-well  controlled -continue Lexapro 10 mg daily  3. Vitamin D deficiency - VITAMIN D 25 Hydroxy (Vit-D Deficiency, Fractures) -continue Vit D supplement   4. Exposure to Streptococcal pharyngitis - azithromycin (ZITHROMAX) 250 MG tablet; Take 2 tablets on day 1, then 1 tablet daily on days 2 through 5  Dispense: 6 tablet; Refill: 0  5. Mild intermittent asthma with acute exacerbation - Fluticasone-Umeclidin-Vilant (TRELEGY ELLIPTA) 100-62.5-25 MCG/INH AEPB; Inhale 1 puff into the lungs daily.  Dispense: 2 each; Refill: 0   Use Trelegy inhaler once daily, rinse mouth afterwards Take Z-pack as directed Consider smoking cessation We will call you with lab results Follow-up 15-months, or sooner if needed    Follow-up: 58-months  An After Visit Summary was printed and given to the patient.  Signed, Janie Morning, NP [facility] [phone]

## 2021-03-21 ENCOUNTER — Ambulatory Visit (INDEPENDENT_AMBULATORY_CARE_PROVIDER_SITE_OTHER): Payer: 59 | Admitting: Nurse Practitioner

## 2021-03-21 ENCOUNTER — Encounter: Payer: Self-pay | Admitting: Nurse Practitioner

## 2021-03-21 ENCOUNTER — Other Ambulatory Visit: Payer: Self-pay | Admitting: Nurse Practitioner

## 2021-03-21 ENCOUNTER — Other Ambulatory Visit: Payer: Self-pay

## 2021-03-21 VITALS — BP 120/74 | HR 89 | Temp 97.2°F | Ht 64.0 in | Wt 183.0 lb

## 2021-03-21 DIAGNOSIS — F900 Attention-deficit hyperactivity disorder, predominantly inattentive type: Secondary | ICD-10-CM | POA: Diagnosis not present

## 2021-03-21 DIAGNOSIS — J4521 Mild intermittent asthma with (acute) exacerbation: Secondary | ICD-10-CM

## 2021-03-21 DIAGNOSIS — Z20818 Contact with and (suspected) exposure to other bacterial communicable diseases: Secondary | ICD-10-CM

## 2021-03-21 DIAGNOSIS — F418 Other specified anxiety disorders: Secondary | ICD-10-CM | POA: Diagnosis not present

## 2021-03-21 DIAGNOSIS — E559 Vitamin D deficiency, unspecified: Secondary | ICD-10-CM

## 2021-03-21 MED ORDER — TRELEGY ELLIPTA 100-62.5-25 MCG/INH IN AEPB
1.0000 | INHALATION_SPRAY | Freq: Every day | RESPIRATORY_TRACT | 0 refills | Status: DC
Start: 2021-03-21 — End: 2021-06-22

## 2021-03-21 MED ORDER — ESCITALOPRAM OXALATE 10 MG PO TABS: 10.0000 mg | ORAL_TABLET | Freq: Every day | ORAL | 0 refills | Status: DC

## 2021-03-21 MED ORDER — AMPHETAMINE-DEXTROAMPHETAMINE 30 MG PO TABS: 30.0000 mg | ORAL_TABLET | Freq: Two times a day (BID) | ORAL | 0 refills | Status: DC

## 2021-03-21 MED ORDER — AZITHROMYCIN 250 MG PO TABS: ORAL_TABLET | ORAL | 0 refills | Status: AC

## 2021-03-21 NOTE — Patient Instructions (Signed)
Use Trelegy inhaler once daily, rinse mouth afterwards Take Z-pack as directed Consider smoking cessation We will call you with lab results Follow-up 75-months, or sooner if needed  Asthma Attack Prevention, Adult Although you may not be able to control the fact that you have asthma, you can take actions to prevent episodes of asthma (asthma attacks). How can this condition affect me? Asthma attacks (flare ups) can cause trouble breathing, wheezing, and coughing. They may keep you fromdoing activities you like to do. What can increase my risk? Coming into contact with things that cause asthma symptoms (asthma triggers) can put you at risk for an asthma attack. Common asthma triggers include: Things you are allergic to (allergens), such as: Dust mite and cockroach droppings. Pet dander. Mold. Pollen from trees and grasses. Food allergies. This might be a specific food or added chemicals called sulfites. Irritants, such as: Weather changes including very cold, dry, or humid air. Smoke. This includes campfire smoke, air pollution, and tobacco smoke. Strong odors from aerosol sprays and fumes from perfume, candles, and household cleaners. Other triggers, such as: Certain medicines. This includes NSAIDs, such as ibuprofen and aspirin. Viral respiratory infections (colds), including runny nose (rhinitis) or infection in the sinuses (sinusitis). Activity including exercise, laughing, or crying. Not using inhaled medicines (corticosteroids) as told. What actions can I take to prevent an asthma attack? Stay healthy. Stay up to date on all immunizations as told by your health care provider, including the yearly flu (influenza) vaccine and pneumonia vaccine. Many asthma attacks can be prevented by carefully following your written asthma action plan. Follow your asthma action plan Work with your health care provider to create a written asthma action plan. This plan should include: A list of your  asthma triggers and how to avoid or reduce them. A list of symptoms that you may have during an asthma attack. Information about which medicine to take, when to take the medicine, and how much of the medicine to take. Information to help you understand your peak flow measurements. Daily actions that you can take to prevent (control) your asthma symptoms. Contact information for your health care providers. If you have an asthma attack, act quickly. Follow the emergency steps on your written asthma action plan. This may prevent you from needing to go to the hospital. Monitor your asthma. To do this: Use your peak flow meter every morning and every evening for 2-3 weeks or as told by your health care provider. Record the results in a journal. A drop in your peak flow numbers on one or more days may mean that you are starting to have an asthma attack, even if you are not having symptoms. When you have asthma symptoms, write them down in a journal. Write down in your journal how often you need to use your fast-acting rescue inhaler. If you are using your rescue inhaler more often, it may mean that your asthma is not under control. Talk with your health care provider about adjusting your asthma treatment plan to help you prevent future asthma attacks and [place] better control of your condition.  Lifestyle Avoid or reduce contact with known outdoor allergens by staying indoors, keeping windows closed, and using air conditioning when pollen and mold counts are high. Do not use any products that contain nicotine or tobacco, such as cigarettes, e-cigarettes, and chewing tobacco. If you need help quitting, ask your health care provider. If you are overweight, consider a weight-loss plan. Find ways to cope with stress and  your feelings, such as mindfulness, relaxation, or breathing exercises. Ask your health care provider if a breathing exercise program (pulmonary rehabilitation) may be helpful to control  symptoms and improve your quality of life. Medicines  Take over-the-counter and prescription medicines only as told by your health care provider. Do not stop taking your medicine and do not take less medicine even if you are doing well. Let your health care provider know: How often you use your rescue inhaler. How often you have symptoms when you are taking your regular medicines. If you wake up at night because of asthma symptoms. If you have more trouble with your breathing when you exercise.  Activity Do your normal activities as told by your health care provider. Ask your health care provider what activities are safe for you. Some people have asthma symptoms or more asthma symptoms when they exercise. This is called exercise-induced bronchoconstriction (EIB). If you have this problem, talk with your health care provider about how to manage EIB. Some tips to follow include: Use your fast-acting inhaler before exercise. Exercise indoors if it is very cold, humid, or the pollen and mold counts are high. Warm up and cool down before and after exercise. Stop exercising right away if your asthma symptoms start or get worse. Where to find more information Asthma and Allergy Foundation of America: www.aafa.org Centers for Disease Control and Prevention: FootballExhibition.com.br American Lung Association: www.lung.org National Heart, Lung, and Blood Institute: PopSteam.is World Health Organization: https://castaneda-walker.com/ Get help right away if: You have followed your written asthma action plan and your symptoms are not improving. Summary Asthma attacks (flare ups) can cause trouble breathing, wheezing, and coughing. They may keep you from doing activities you normally like to do. Work with your health care provider to create a written asthma action plan. Do not stop taking your medicine and do not take less medicine even if you are doing well. Do not use any products that contain nicotine or tobacco, such as  cigarettes, e-cigarettes, and chewing tobacco. If you need help quitting, ask your health care provider. This information is not intended to replace advice given to you by your health care provider. Make sure you discuss any questions you have with your healthcare provider. Document Revised: 09/14/2019 Document Reviewed: 09/14/2019 Elsevier Patient Education  2022 [employer].

## 2021-03-22 LAB — CBC WITH DIFFERENTIAL/PLATELET
Basophils Absolute: 0 10*3/uL (ref 0.0–0.2)
Basos: 1 %
EOS (ABSOLUTE): 0.2 10*3/uL (ref 0.0–0.4)
Eos: 2 %
Hematocrit: 41.1 % (ref 34.0–46.6)
Hemoglobin: 13.8 g/dL (ref 11.1–15.9)
Immature Grans (Abs): 0 10*3/uL (ref 0.0–0.1)
Immature Granulocytes: 0 %
Lymphocytes Absolute: 1.5 10*3/uL (ref 0.7–3.1)
Lymphs: 18 %
MCH: 32.5 pg (ref 26.6–33.0)
MCHC: 33.6 g/dL (ref 31.5–35.7)
MCV: 97 fL (ref 79–97)
Monocytes Absolute: 0.6 10*3/uL (ref 0.1–0.9)
Monocytes: 7 %
Neutrophils Absolute: 5.9 10*3/uL (ref 1.4–7.0)
Neutrophils: 72 %
Platelets: 256 10*3/uL (ref 150–450)
RBC: 4.24 x10E6/uL (ref 3.77–5.28)
RDW: 12.7 % (ref 11.7–15.4)
WBC: 8.2 10*3/uL (ref 3.4–10.8)

## 2021-03-22 LAB — COMPREHENSIVE METABOLIC PANEL
ALT: 18 IU/L (ref 0–32)
AST: 16 IU/L (ref 0–40)
Albumin/Globulin Ratio: 2.7 — ABNORMAL HIGH (ref 1.2–2.2)
Albumin: 4.6 g/dL (ref 3.8–4.8)
Alkaline Phosphatase: 59 IU/L (ref 44–121)
BUN/Creatinine Ratio: 11 (ref 9–23)
BUN: 8 mg/dL (ref 6–20)
Bilirubin Total: 0.3 mg/dL (ref 0.0–1.2)
CO2: 24 mmol/L (ref 20–29)
Calcium: 8.8 mg/dL (ref 8.7–10.2)
Chloride: 103 mmol/L (ref 96–106)
Creatinine, Ser: 0.71 mg/dL (ref 0.57–1.00)
Globulin, Total: 1.7 g/dL (ref 1.5–4.5)
Glucose: 93 mg/dL (ref 65–99)
Potassium: 4.6 mmol/L (ref 3.5–5.2)
Sodium: 143 mmol/L (ref 134–144)
Total Protein: 6.3 g/dL (ref 6.0–8.5)
eGFR: 114 mL/min/{1.73_m2} (ref >59)

## 2021-03-22 LAB — VITAMIN D 25 HYDROXY (VIT D DEFICIENCY, FRACTURES): Vit D, 25-Hydroxy: 49.7 ng/mL (ref 30.0–100.0)

## 2021-03-26 ENCOUNTER — Other Ambulatory Visit: Payer: Self-pay

## 2021-03-26 DIAGNOSIS — F900 Attention-deficit hyperactivity disorder, predominantly inattentive type: Secondary | ICD-10-CM

## 2021-03-26 MED ORDER — AMPHETAMINE-DEXTROAMPHETAMINE 30 MG PO TABS: 30.0000 mg | ORAL_TABLET | Freq: Two times a day (BID) | ORAL | 0 refills | Status: DC

## 2021-05-14 ENCOUNTER — Other Ambulatory Visit: Payer: Self-pay | Admitting: Nurse Practitioner

## 2021-05-14 ENCOUNTER — Other Ambulatory Visit: Payer: Self-pay

## 2021-05-14 DIAGNOSIS — F900 Attention-deficit hyperactivity disorder, predominantly inattentive type: Secondary | ICD-10-CM

## 2021-05-14 DIAGNOSIS — F418 Other specified anxiety disorders: Secondary | ICD-10-CM

## 2021-05-14 MED ORDER — AMPHETAMINE-DEXTROAMPHETAMINE 30 MG PO TABS: 30.0000 mg | ORAL_TABLET | Freq: Two times a day (BID) | ORAL | 0 refills | Status: DC

## 2021-05-14 MED ORDER — ESCITALOPRAM OXALATE 10 MG PO TABS: 10.0000 mg | ORAL_TABLET | Freq: Every day | ORAL | 0 refills | Status: DC

## 2021-06-20 ENCOUNTER — Other Ambulatory Visit: Payer: Self-pay

## 2021-06-20 DIAGNOSIS — F418 Other specified anxiety disorders: Secondary | ICD-10-CM

## 2021-06-20 DIAGNOSIS — F900 Attention-deficit hyperactivity disorder, predominantly inattentive type: Secondary | ICD-10-CM

## 2021-06-20 MED ORDER — AMPHETAMINE-DEXTROAMPHETAMINE 30 MG PO TABS: 30.0000 mg | ORAL_TABLET | Freq: Two times a day (BID) | ORAL | 0 refills | Status: DC

## 2021-06-20 MED ORDER — ALBUTEROL SULFATE HFA 108 (90 BASE) MCG/ACT IN AERS: 1.0000 | INHALATION_SPRAY | Freq: Four times a day (QID) | RESPIRATORY_TRACT | 1 refills | Status: DC | PRN

## 2021-06-20 MED ORDER — ESCITALOPRAM OXALATE 10 MG PO TABS: 10.0000 mg | ORAL_TABLET | Freq: Every day | ORAL | 0 refills | Status: DC

## 2021-06-22 ENCOUNTER — Other Ambulatory Visit: Payer: Self-pay | Admitting: Nurse Practitioner

## 2021-06-22 ENCOUNTER — Other Ambulatory Visit: Payer: Self-pay

## 2021-06-22 ENCOUNTER — Encounter: Payer: Self-pay | Admitting: Nurse Practitioner

## 2021-06-22 ENCOUNTER — Ambulatory Visit (INDEPENDENT_AMBULATORY_CARE_PROVIDER_SITE_OTHER): Payer: 59 | Admitting: Nurse Practitioner

## 2021-06-22 VITALS — BP 122/72 | HR 86 | Temp 97.0°F | Ht 64.0 in | Wt 184.0 lb

## 2021-06-22 DIAGNOSIS — R21 Rash and other nonspecific skin eruption: Secondary | ICD-10-CM

## 2021-06-22 DIAGNOSIS — M545 Low back pain, unspecified: Secondary | ICD-10-CM

## 2021-06-22 DIAGNOSIS — J4541 Moderate persistent asthma with (acute) exacerbation: Secondary | ICD-10-CM | POA: Diagnosis not present

## 2021-06-22 DIAGNOSIS — M79605 Pain in left leg: Secondary | ICD-10-CM

## 2021-06-22 DIAGNOSIS — J3089 Other allergic rhinitis: Secondary | ICD-10-CM | POA: Diagnosis not present

## 2021-06-22 DIAGNOSIS — R5383 Other fatigue: Secondary | ICD-10-CM

## 2021-06-22 DIAGNOSIS — F900 Attention-deficit hyperactivity disorder, predominantly inattentive type: Secondary | ICD-10-CM | POA: Diagnosis not present

## 2021-06-22 DIAGNOSIS — J4520 Mild intermittent asthma, uncomplicated: Secondary | ICD-10-CM

## 2021-06-22 DIAGNOSIS — H65113 Acute and subacute allergic otitis media (mucoid) (sanguinous) (serous), bilateral: Secondary | ICD-10-CM

## 2021-06-22 DIAGNOSIS — J4521 Mild intermittent asthma with (acute) exacerbation: Secondary | ICD-10-CM

## 2021-06-22 DIAGNOSIS — F17219 Nicotine dependence, cigarettes, with unspecified nicotine-induced disorders: Secondary | ICD-10-CM

## 2021-06-22 DIAGNOSIS — Z862 Personal history of diseases of the blood and blood-forming organs and certain disorders involving the immune mechanism: Secondary | ICD-10-CM

## 2021-06-22 DIAGNOSIS — Z87898 Personal history of other specified conditions: Secondary | ICD-10-CM

## 2021-06-22 MED ORDER — TRIAMCINOLONE ACETONIDE 40 MG/ML IJ SUSP
40.0000 mg | Freq: Once | INTRAMUSCULAR | Status: AC
Start: 2021-06-22 — End: 2021-06-22
  Administered 2021-06-22: 40 mg via INTRAMUSCULAR

## 2021-06-22 MED ORDER — NEOMYCIN-POLYMYXIN-HC 3.5-10000-1 OT SOLN: 3.0000 [drp] | Freq: Two times a day (BID) | OTIC | 0 refills | Status: AC

## 2021-06-22 MED ORDER — HYDROXYZINE PAMOATE 100 MG PO CAPS
100.0000 mg | ORAL_CAPSULE | Freq: Three times a day (TID) | ORAL | 0 refills | Status: AC | PRN
Start: 2021-06-22 — End: ?

## 2021-06-22 MED ORDER — TRELEGY ELLIPTA 200-62.5-25 MCG/INH IN AEPB: 1.0000 | INHALATION_SPRAY | Freq: Every day | RESPIRATORY_TRACT | 0 refills | Status: AC

## 2021-06-22 MED ORDER — TRELEGY ELLIPTA 100-62.5-25 MCG/INH IN AEPB: 1.0000 | INHALATION_SPRAY | Freq: Every day | RESPIRATORY_TRACT | 0 refills | Status: DC

## 2021-06-22 MED ORDER — ALBUTEROL SULFATE HFA 108 (90 BASE) MCG/ACT IN AERS: 1.0000 | INHALATION_SPRAY | Freq: Four times a day (QID) | RESPIRATORY_TRACT | 1 refills | Status: AC | PRN

## 2021-06-22 MED ORDER — AMPHETAMINE-DEXTROAMPHETAMINE 30 MG PO TABS: 30.0000 mg | ORAL_TABLET | Freq: Two times a day (BID) | ORAL | 0 refills | Status: DC

## 2021-06-22 MED ORDER — KETOROLAC TROMETHAMINE 60 MG/2ML IM SOLN
60.0000 mg | Freq: Once | INTRAMUSCULAR | Status: AC
Start: 2021-06-22 — End: 2021-06-22
  Administered 2021-06-22: 60 mg via INTRAMUSCULAR

## 2021-06-22 NOTE — Progress Notes (Signed)
Subjective:  Patient ID: Julia Humphrey, female    DOB: Aug 07, 1986  Age: 35 y.o. MRN: [id number]  Chief Complaint  Patient presents with  . ADHD    HPI  Julia Humphrey is a 35 year old Caucasian female that presents for follow-up of ADHD and depression. She has multiple acute complaints that include dyspnea related to asthma exacerbation, left lower back pain, and bilateral ear pain, and rash to bilateral lower extremities. Julia Humphrey tells me that she is working two jobs, puts in over 70 hours weekly. States she is exhausted. She tells me she is a single mother supporting her three daughters and a new grandchild.   ADHD Julia Humphrey was diagnosed with adult ADHD, predominantly inattentive type, several years ago. Current treatment is Adderall 30 mg BID. States her symptoms are currently well-controlled.  Depression, Follow-up  She  was last seen for this 3 months ago. She recently stopped Lexapro 10 mg daily She reports fair compliance with treatment. She is not having side effects.   She reports good tolerance of treatment. Current symptoms include: fatigue She feels she is Improved since last visit.  Depression screen [facility] 2/9 06/22/2021 11/01/2020  Decreased Interest 1 2  Down, Depressed, Hopeless 1 1  PHQ - 2 Score 2 3  Altered sleeping 0 3  Tired, decreased energy 1 2  Change in appetite 1 3  Feeling bad or failure about yourself  1 1  Trouble concentrating 2 3  Moving slowly or fidgety/restless 2 3  Suicidal thoughts 0 0  PHQ-9 Score 9 18  Difficult doing work/chores Somewhat difficult Somewhat difficult     Back Pain  She reports recurrent back pain. There was not an injury that may have caused the pain. The most recent episode started about a week ago and is staying constant. The pain is located in the left lumbar areato the left thigh. It is described as aching, is 6/10 in intensity, occurring intermittently. Symptoms are worse in the: evening  Aggravating factors: standing  Relieving factors: none.  She has tried NSAIDs with little relief.   Associated symptoms: No abdominal pain No bowel incontinence  No chest pain No dysuria   No fever No headaches  Yes joint pains left knee No pelvic pain  No weakness in leg  No tingling in lower extremities  No urinary incontinence No weight loss    Ear Pain: Patient presents with bilateral ear pain.  Symptoms include bilateral ear pain, congestion, cough, and sore throat. Symptoms began 1 week ago and are gradually worsening since that time. Patient denies fever. Ear history: a few previous ear infections. Julia Humphrey is new to the [facility] area. Moved here recently form [place]. States she has developed allergic rhinitis to unknown environmental allergens.   Asthma: Patient complains of possible asthma. The patient has been previously diagnosed with asthma. Symptoms have previously included . Associated symptoms include headache   .  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  Symptoms have been gradually worsening since their onset. Observed precipitants include upper respiratory infection.  Current limitations in activity from asthma include none.  Number of days of school or work missed in the last month: 0. The previous exacerbation occurred a few years ago. The patient has been deviating from this regimen as follows: not using inhalers as prescribed and continued cigarette smoking  Current Outpatient Medications on File Prior to Visit  Medication Sig Dispense Refill  . albuterol (VENTOLIN HFA) 108 (90 Base) MCG/ACT inhaler Inhale 1-2 puffs into  the lungs every 6 (six) hours as needed for wheezing or shortness of breath. 1 each 1  . amphetamine-dextroamphetamine (ADDERALL) 30 MG tablet Take 1 tablet by mouth 2 (two) times daily. 60 tablet 0  . Fluticasone-Umeclidin-Vilant (TRELEGY ELLIPTA) 100-62.5-25 MCG/INH AEPB Inhale 1 puff into the lungs daily. 2 each 0  . VITAMIN D PO Take  by mouth.     No current facility-administered medications on file prior to visit.   Past Medical History:  Diagnosis Date  . ADHD   . Anxiety   . Asthma   . Depression   . Rheumatoid arthritis [facility])    Past Surgical History:  Procedure Laterality Date  . TUBAL LIGATION      Family History  Problem Relation Age of Onset  . Rheum arthritis Mother   . Hypertension Mother   . Brain cancer Father   . Hypertension Sister   . Breast cancer Maternal Grandmother    Social History   Socioeconomic History  . Marital status: Single    Spouse name: Not on file  . Number of children: 3  . Years of education: Not on file  . Highest education level: High school graduate  Occupational History  . Not on file  Tobacco Use  . Smoking status: Every Day    Packs/day: 0.50    Types: Cigarettes  . Smokeless tobacco: Never  . Tobacco comments:    began smoking at age 23  Vaping Use  . Vaping Use: Some days  Substance and Sexual Activity  . Alcohol use: Yes    Comment: occasional  . 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  . Sexual activity: Not Currently    Birth control/protection: Pill  Other Topics Concern  . Not on file  Social History Narrative  . Not on file   Social Determinants of Health   Financial Resource Strain: Not on file  Food Insecurity: Not on file  Transportation Needs: No Transportation Needs  . Lack of Transportation (Medical): No  . Lack of Transportation (Non-Medical): No  Physical Activity: Not on file  Stress: Not on file  Social Connections: Not on file    Review of Systems  Constitutional:  Positive for fatigue. Negative for appetite change and fever.  HENT:  Positive for congestion, ear pain (bilateral), postnasal drip, rhinorrhea and sore throat. Negative for sinus pressure.   Eyes:  Negative for pain.  Respiratory:  Positive for cough, chest tightness, shortness of breath and wheezing.    Cardiovascular:  Negative for chest pain and palpitations.  Gastrointestinal:  Positive for constipation. Negative for abdominal pain, diarrhea, nausea and vomiting.  Endocrine: Positive for polydipsia.  Genitourinary:  Negative for dysuria and hematuria.  Musculoskeletal:  Positive for back pain (left lower back) and myalgias. Negative for arthralgias and joint swelling.  Skin:  Positive for rash (bilateral lower legs).  Allergic/Immunologic: Positive for environmental allergies.  Neurological:  Positive for headaches. Negative for dizziness and weakness.  Hematological:  Bruises/bleeds easily.  Psychiatric/Behavioral:  Positive for decreased concentration and sleep disturbance. Negative for dysphoric mood. The patient is nervous/anxious.     Objective:  BP 122/72 (BP Location: Left Arm, Patient Position: Sitting)   Pulse 86   Temp (!) 97 F (36.1 C) (Temporal)   Ht 5\' 4"  (1.626 m)   Wt 184 lb (83.5 kg)   SpO2 100%   BMI 31.58 kg/m   BP/Weight 06/22/2021 03/21/2021 11/15/2020  Systolic BP 122 120  120  Diastolic BP 72 74 78  Wt. (Lbs) 184 183 -  BMI 31.58 31.41 -    Physical Exam Vitals reviewed.  Constitutional:      Appearance: Normal appearance.  HENT:     Head: Normocephalic.     Right Ear: Tenderness present. Tympanic membrane is injected and erythematous.     Left Ear: Tenderness present. Tympanic membrane is injected and erythematous.     Nose: Congestion and rhinorrhea present.     Mouth/Throat:     Mouth: Mucous membranes are moist.     Pharynx: Posterior oropharyngeal erythema present.  Cardiovascular:     Rate and Rhythm: Normal rate and regular rhythm.     Pulses: Normal pulses.     Heart sounds: Normal heart sounds.  Pulmonary:     Effort: Pulmonary effort is normal.     Breath sounds: Wheezing present.  Abdominal:     General: Bowel sounds are normal.     Palpations: Abdomen is soft.  Musculoskeletal:        General: Normal range of motion.      Cervical back: Neck supple.  Lymphadenopathy:     Cervical: No cervical adenopathy.  Skin:    General: Skin is warm and dry.     Capillary Refill: Capillary refill takes less than 2 seconds.     Findings: Rash present. Rash is papular and urticarial.       Neurological:     General: No focal deficit present.     Mental Status: She is alert and oriented to person, place, and time.  Psychiatric:        Mood and Affect: Mood normal.        Behavior: Behavior normal.     Lab Results  Component Value Date   WBC 8.2 03/21/2021   HGB 13.8 03/21/2021   HCT 41.1 03/21/2021   PLT 256 03/21/2021   GLUCOSE 93 03/21/2021   ALT 18 03/21/2021   AST 16 03/21/2021   NA 143 03/21/2021   K 4.6 03/21/2021   CL 103 03/21/2021   CREATININE 0.71 03/21/2021   BUN 8 03/21/2021   CO2 24 03/21/2021   TSH 2.090 11/01/2020      Assessment & Plan:   1. Moderate persistent asthma with acute exacerbation - Fluticasone-Umeclidin-Vilant (TRELEGY ELLIPTA) 200-62.5-25 MCG/INH AEPB; Inhale 1 puff into the lungs daily.  Dispense: 2 each; Refill: 0 -smoking cessation encouraged  2. Non-seasonal allergic rhinitis due to other allergic trigger -Zyrtec 10 mg daily -Avoid triggers as much as possible  3. Attention deficit hyperactivity disorder (ADHD), predominantly inattentive type-well controlled -continue Adderal 30 mg BID   4. Low back pain radiating to left lower extremity - CBC with Differential/Platelet - Comprehensive metabolic panel - ketorolac (TORADOL) injection 60 mg -Ibuprofen 800 mg as directed -rest and no heavy lifting -perform back exercises -alternate heat and ice to left lower back as needed  5. Non-recurrent acute allergic otitis media of both ears - neomycin-polymyxin-hydrocortisone (CORTISPORIN) OTIC solution; Place 3 drops into both ears 2 (two) times daily for 5 days.  Dispense: 10 mL; Refill: 0  6. Rash - hydrOXYzine (VISTARIL) 100 MG capsule; Take 1 capsule (100 mg  total) by mouth 3 (three) times daily as needed for itching.  Dispense: 30 capsule; Refill: 0 - triamcinolone acetonide (KENALOG-40) injection 40 mg  7. History of bruising easily - CBC with Differential/Platelet  8. Cigarette nicotine dependence with nicotine-induced disorder -risk of cigarette smoking and asthma discussed in detail  including COPD, shortened life span, financial burden due to cost of cigarettes -benefits of smoking cessation discussed in detail including improved overall health   9. Fatigue, unspecified type - CBC with Differential/Platelet - Comprehensive metabolic panel   -rest encouraged  -eat balanced diet     Toradol 60 mg and Kenalog 60 mg steroid injection given in office Begin Hydroxyzine 100 mg three times daily for leg rash Take Flexeril 10 mg up to 3 times daily as needed for muscle spasms Take Ibuprofen 800 mg every 8 hours as needed for back pain Trelegy inhaler daily, rinse mouth afterwards Consider stopping smoking Iron rich diet Take Zyrtec 10 mg nightly Ear drops to twice daily for 5 days Follow-up 62-months   Follow-up: 1-months  An After Visit Summary was printed and given to the patient.  I, Janie Morning, NP, have reviewed all documentation for this visit. The documentation on 06/23/21 for the exam, diagnosis, procedures, and orders are all accurate and complete.    I,Lauren M Auman,acting as a Neurosurgeon for [employer], NP.,have documented all relevant documentation on the behalf of Janie Morning, NP,as directed by  Janie Morning, NP while in the presence of Janie Morning, NP.   Signed,  Janie Morning, NP [facility] [phone]

## 2021-06-22 NOTE — Patient Instructions (Addendum)
Toradol 60 mg and Kenalog 60 mg steroid injection given in office Begin Hydroxyzine 100 mg three times daily for leg rash Take Flexeril 10 mg up to 3 times daily as needed for muscle spasms Take Ibuprofen 800 mg every 8 hours as needed for back pain Trelegy inhaler daily, rinse mouth afterwards Consider stopping smoking Iron rich diet Take Zyrtec 10 mg nightly Ear drops to twice daily for 5 days Follow-up 47-months   Iron-Rich Diet Iron is a mineral that helps your body produce hemoglobin. Hemoglobin is a protein in red blood cells that carries oxygen to your body's tissues. Eating too little iron may cause you to feel weak and tired, and it can increase your risk of infection. Iron is naturally found in many foods, and many foods have iron added to them (are iron-fortified). You may need to follow an iron-rich diet if you do not have enough iron in your body due to certain medical conditions. The amount of iron that you need each day depends on your age, your sex, and any medical conditions you have. Follow instructions from your health care provider or a dietitian about how much iron you should eat each day. What are tips for following this plan? Reading food labels Check food labels to see how many milligrams (mg) of iron are in each serving. Cooking Cook foods in pots and pans that are made from iron. Take these steps to make it easier for your body to absorb iron from certain foods: Soak beans overnight before cooking. Soak whole grains overnight and drain them before using. Ferment flours before baking, such as by using yeast in bread dough. Meal planning When you eat foods that contain iron, you should eat them with foods that are high in vitamin C. These include oranges, peppers, tomatoes, potatoes, and mangoes. Vitamin C helps your body absorb iron. Certain foods and drinks prevent your body from absorbing iron properly. Avoid eating these foods in the same meal as iron-rich foods  or with iron supplements. These foods include: Coffee, black tea, and red wine. Milk, dairy products, and foods that are high in calcium. Beans and soybeans. Whole grains. General information Take iron supplements only as told by your health care provider. An overdose of iron can be life-threatening. If you were prescribed iron supplements, take them with orange juice or a vitamin C supplement. When you eat iron-fortified foods or take an iron supplement, you should also eat foods that naturally contain iron, such as meat, poultry, and fish. Eating naturally iron-rich foods helps your body absorb the iron that is added to other foods or contained in a supplement. Iron from animal sources is better absorbed than iron from plant sources. What foods should I eat? Fruits Prunes. Raisins. Eat fruits high in vitamin C, such as oranges, grapefruits, and strawberries, with iron-rich foods. Vegetables Spinach (cooked). Green peas. Broccoli. Fermented vegetables. Eat vegetables high in vitamin C, such as leafy greens, potatoes, bell peppers, and tomatoes, with iron-rich foods. Grains Iron-fortified breakfast cereal. Iron-fortified whole-wheat bread. Enriched rice. Sprouted grains. Meats and other proteins Beef liver. Beef. [place]. Chicken. Oysters. Shrimp. Tuna. Sardines. Chickpeas. Nuts. Tofu. Pumpkin seeds. Beverages Tomato juice. Fresh orange juice. Prune juice. Hibiscus tea. Iron-fortified instant breakfast shakes. Sweets and desserts Blackstrap molasses. Seasonings and condiments Tahini. Fermented soy sauce. Other foods Wheat germ. The items listed above may not be a complete list of recommended foods and beverages. Contact a dietitian for more information. What foods should I limit? These are  foods that should be limited while eating iron-rich foods as they can reduce the absorption of iron in your body. Grains Whole grains. Bran cereal. Bran flour. Meats and other proteins Soybeans.  Products made from soy protein. Black beans. Lentils. Mung beans. Split peas. Dairy Milk. Cream. Cheese. Yogurt. Cottage cheese. Beverages Coffee. Black tea. Red wine. Sweets and desserts Cocoa. Chocolate. Ice cream. Seasonings and condiments Basil. Oregano. Large amounts of parsley. The items listed above may not be a complete list of foods and beverages you should limit. Contact a dietitian for more information. Summary Iron is a mineral that helps your body produce hemoglobin. Hemoglobin is a protein in red blood cells that carries oxygen to your body's tissues. Iron is naturally found in many foods, and many foods have iron added to them (are iron-fortified). When you eat foods that contain iron, you should eat them with foods that are high in vitamin C. Vitamin C helps your body absorb iron. Certain foods and drinks prevent your body from absorbing iron properly, such as whole grains and dairy products. You should avoid eating these foods in the same meal as iron-rich foods or with iron supplements. This information is not intended to replace advice given to you by your health care provider. Make sure you discuss any questions you have with your health care provider. Document Revised: 08/28/2020 Document Reviewed: 08/28/2020 [employer] Patient Education  2022 [employer]  Follow-up in 52-months    Acute Back Pain, Adult Acute back pain is sudden and usually short-lived. It is often caused by an injury to the muscles and tissues in the back. The injury may result from: A muscle, tendon, or ligament getting overstretched or torn. Ligaments are tissues that connect bones to each other. Lifting something improperly can cause a back strain. Wear and tear (degeneration) of the spinal disks. Spinal disks are circular tissue that provide cushioning between the bones of the spine (vertebrae). Twisting motions, such as while playing sports or doing yard work. A hit to the  back. Arthritis. You may have a physical exam, lab tests, and imaging tests to find the cause of your pain. Acute back pain usually goes away with rest and home care. Follow these instructions at home: Managing pain, stiffness, and swelling Take over-the-counter and prescription medicines only as told by your health care provider. Treatment may include medicines for pain and inflammation that are taken by mouth or applied to the skin, or muscle relaxants. Your health care provider may recommend applying ice during the first 24-48 hours after your pain starts. To do this: Put ice in a plastic bag. Place a towel between your skin and the bag. Leave the ice on for 20 minutes, 2-3 times a day. Remove the ice if your skin turns bright red. This is very important. If you cannot feel pain, heat, or cold, you have a greater risk of damage to the area. If directed, apply heat to the affected area as often as told by your health care provider. Use the heat source that your health care provider recommends, such as a moist heat pack or a heating pad. Place a towel between your skin and the heat source. Leave the heat on for 20-30 minutes. Remove the heat if your skin turns bright red. This is especially important if you are unable to feel pain, heat, or cold. You have a greater risk of getting burned. Activity  Do not stay in bed. Staying in bed for more than 1-2 days can  delay your recovery. Sit up and stand up straight. Avoid leaning forward when you sit or hunching over when you stand. If you work at a desk, sit close to it so you do not need to lean over. Keep your chin tucked in. Keep your neck drawn back, and keep your elbows bent at a 90-degree angle (right angle). Sit high and close to the steering wheel when you drive. Add lower back (lumbar) support to your car seat, if needed. Take short walks on even surfaces as soon as you are able. Try to increase the length of time you walk each day. Do not  sit, drive, or stand in one place for more than 30 minutes at a time. Sitting or standing for long periods of time can put stress on your back. Do not drive or use heavy machinery while taking prescription pain medicine. Use proper lifting techniques. When you bend and lift, use positions that put less stress on your back: Williams your knees. Keep the load close to your body. Avoid twisting. Exercise regularly as told by your health care provider. Exercising helps your back heal faster and helps prevent back injuries by keeping muscles strong and flexible. Work with a physical therapist to make a safe exercise program, as recommended by your health care provider. Do any exercises as told by your physical therapist. Lifestyle Maintain a healthy weight. Extra weight puts stress on your back and makes it difficult to have good posture. Avoid activities or situations that make you feel anxious or stressed. Stress and anxiety increase muscle tension and can make back pain worse. Learn ways to manage anxiety and stress, such as through exercise. General instructions Sleep on a firm mattress in a comfortable position. Try lying on your side with your knees slightly bent. If you lie on your back, put a pillow under your knees. Keep your head and neck in a straight line with your spine (neutral position) when using electronic equipment like smartphones or pads. To do this: Raise your smartphone or pad to look at it instead of bending your head or neck to look down. Put the smartphone or pad at the level of your face while looking at the screen. Follow your treatment plan as told by your health care provider. This may include: Cognitive or behavioral therapy. Acupuncture or massage therapy. Meditation or yoga. Contact a health care provider if: You have pain that is not relieved with rest or medicine. You have increasing pain going down into your legs or buttocks. Your pain does not improve after 2  weeks. You have pain at night. You lose weight without trying. You have a fever or chills. You develop nausea or vomiting. You develop abdominal pain. Get help right away if: You develop new bowel or bladder control problems. You have unusual weakness or numbness in your arms or legs. You feel faint. These symptoms may represent a serious problem that is an emergency. Do not wait to see if the symptoms will go away. Get medical help right away. Call your local emergency services (911 in the [place] Do not drive yourself to the hospital. Summary Acute back pain is sudden and usually short-lived. Use proper lifting techniques. When you bend and lift, use positions that put less stress on your back. Take over-the-counter and prescription medicines only as told by your health care provider, and apply heat or ice as told. This information is not intended to replace advice given to you by your health care provider. Make  sure you discuss any questions you have with your health care provider. Document Revised: 12/08/2020 Document Reviewed: 12/08/2020 [employer] Patient Education  2022 [employer] Low Back Sprain or Strain Rehab Ask your health care provider which exercises are safe for you. Do exercises exactly as told by your health care provider and adjust them as directed. It is normal to feel mild stretching, pulling, tightness, or discomfort as you do these exercises. Stop right away if you feel sudden pain or your pain gets worse. Do not begin these exercises until told by your health care provider. Stretching and range-of-motion exercises These exercises warm up your muscles and joints and improve the movement and flexibility of your back. These exercises also help to relieve pain, numbness, and tingling. Lumbar rotation  Lie on your back on a firm bed or the floor with your knees bent. Straighten your arms out to your sides so each arm forms a 90-degree angle (right angle) with a side of  your body. Slowly move (rotate) both of your knees to one side of your body until you feel a stretch in your lower back (lumbar). Try not to let your shoulders lift off the floor. Hold this position for __________ seconds. Tense your abdominal muscles and slowly move your knees back to the starting position. Repeat this exercise on the other side of your body. Repeat __________ times. Complete this exercise __________ times a day. Single knee to chest  Lie on your back on a firm bed or the floor with both legs straight. Bend one of your knees. Use your hands to move your knee up toward your chest until you feel a gentle stretch in your lower back and buttock. Hold your leg in this position by holding on to the front of your knee. Keep your other leg as straight as possible. Hold this position for __________ seconds. Slowly return to the starting position. Repeat with your other leg. Repeat __________ times. Complete this exercise __________ times a day. Prone extension on elbows  Lie on your abdomen on a firm bed or the floor (prone position). Prop yourself up on your elbows. Use your arms to help lift your chest up until you feel a gentle stretch in your abdomen and your lower back. This will place some of your body weight on your elbows. If this is uncomfortable, try stacking pillows under your chest. Your hips should stay down, against the surface that you are lying on. Keep your hip and back muscles relaxed. Hold this position for __________ seconds. Slowly relax your upper body and return to the starting position. Repeat __________ times. Complete this exercise __________ times a day. Strengthening exercises These exercises build strength and endurance in your back. Endurance is the ability to use your muscles for a long time, even after they get tired. Pelvic tilt This exercise strengthens the muscles that lie deep in the abdomen. Lie on your back on a firm bed or the floor with  your legs extended. Bend your knees so they are pointing toward the ceiling and your feet are flat on the floor. Tighten your lower abdominal muscles to press your lower back against the floor. This motion will tilt your pelvis so your tailbone points up toward the ceiling instead of pointing to your feet or the floor. To help with this exercise, you may place a small towel under your lower back and try to push your back into the towel. Hold this position for __________ seconds. Let your muscles relax  completely before you repeat this exercise. Repeat __________ times. Complete this exercise __________ times a day. Alternating arm and leg raises  Get on your hands and knees on a firm surface. If you are on a hard floor, you may want to use padding, such as an exercise mat, to cushion your knees. Line up your arms and legs. Your hands should be directly below your shoulders, and your knees should be directly below your hips. Lift your left leg behind you. At the same time, raise your right arm and straighten it in front of you. Do not lift your leg higher than your hip. Do not lift your arm higher than your shoulder. Keep your abdominal and back muscles tight. Keep your hips facing the ground. Do not arch your back. Keep your balance carefully, and do not hold your breath. Hold this position for __________ seconds. Slowly return to the starting position. Repeat with your right leg and your left arm. Repeat __________ times. Complete this exercise __________ times a day. Abdominal set with straight leg raise  Lie on your back on a firm bed or the floor. Bend one of your knees and keep your other leg straight. Tense your abdominal muscles and lift your straight leg up, 4-6 inches (10-15 cm) off the ground. Keep your abdominal muscles tight and hold this position for __________ seconds. Do not hold your breath. Do not arch your back. Keep it flat against the ground. Keep your abdominal  muscles tense as you slowly lower your leg back to the starting position. Repeat with your other leg. Repeat __________ times. Complete this exercise __________ times a day. Single leg lower with bent knees Lie on your back on a firm bed or the floor. Tense your abdominal muscles and lift your feet off the floor, one foot at a time, so your knees and hips are bent in 90-degree angles (right angles). Your knees should be over your hips and your lower legs should be parallel to the floor. Keeping your abdominal muscles tense and your knee bent, slowly lower one of your legs so your toe touches the ground. Lift your leg back up to return to the starting position. Do not hold your breath. Do not let your back arch. Keep your back flat against the ground. Repeat with your other leg. Repeat __________ times. Complete this exercise __________ times a day. Posture and body mechanics Good posture and healthy body mechanics can help to relieve stress in your body's tissues and joints. Body mechanics refers to the movements and positions of your body while you do your daily activities. Posture is part of body mechanics. Good posture means: Your spine is in its natural S-curve position (neutral). Your shoulders are pulled back slightly. Your head is not tipped forward (neutral). Follow these guidelines to improve your posture and body mechanics in your everyday activities. Standing  When standing, keep your spine neutral and your feet about hip-width apart. Keep a slight bend in your knees. Your ears, shoulders, and hips should line up. When you do a task in which you stand in one place for a long time, place one foot up on a stable object that is 2-4 inches (5-10 cm) high, such as a footstool. This helps keep your spine neutral. Sitting  When sitting, keep your spine neutral and keep your feet flat on the floor. Use a footrest, if necessary, and keep your thighs parallel to the floor. Avoid rounding  your shoulders, and avoid tilting your head  forward. When working at a desk or a computer, keep your desk at a height where your hands are slightly lower than your elbows. Slide your chair under your desk so you are close enough to maintain good posture. When working at a computer, place your monitor at a height where you are looking straight ahead and you do not have to tilt your head forward or downward to look at the screen. Resting When lying down and resting, avoid positions that are most painful for you. If you have pain with activities such as sitting, bending, stooping, or squatting, lie in a position in which your body does not bend very much. For example, avoid curling up on your side with your arms and knees near your chest (fetal position). If you have pain with activities such as standing for a long time or reaching with your arms, lie with your spine in a neutral position and bend your knees slightly. Try the following positions: Lying on your side with a pillow between your knees. Lying on your back with a pillow under your knees. Lifting  When lifting objects, keep your feet at least shoulder-width apart and tighten your abdominal muscles. Bend your knees and hips and keep your spine neutral. It is important to lift using the strength of your legs, not your back. Do not lock your knees straight out. Always ask for help to lift heavy or awkward objects. This information is not intended to replace advice given to you by your health care provider. Make sure you discuss any questions you have with your health care provider. Document Revised: 12/04/2020 Document Reviewed: 12/04/2020 [employer] Patient Education  2022 [employer]    Low Back Sprain or Strain Rehab Ask your health care provider which exercises are safe for you. Do exercises exactly as told by your health care provider and adjust them as directed. It is normal to feel mild stretching, pulling, tightness, or discomfort  as you do these exercises. Stop right away if you feel sudden pain or your pain gets worse. Do not begin these exercises until told by your health care provider. Stretching and range-of-motion exercises These exercises warm up your muscles and joints and improve the movement and flexibility of your back. These exercises also help to relieve pain, numbness, and tingling. Lumbar rotation  Lie on your back on a firm bed or the floor with your knees bent. Straighten your arms out to your sides so each arm forms a 90-degree angle (right angle) with a side of your body. Slowly move (rotate) both of your knees to one side of your body until you feel a stretch in your lower back (lumbar). Try not to let your shoulders lift off the floor. Hold this position for __________ seconds. Tense your abdominal muscles and slowly move your knees back to the starting position. Repeat this exercise on the other side of your body. Repeat __________ times. Complete this exercise __________ times a day. Single knee to chest  Lie on your back on a firm bed or the floor with both legs straight. Bend one of your knees. Use your hands to move your knee up toward your chest until you feel a gentle stretch in your lower back and buttock. Hold your leg in this position by holding on to the front of your knee. Keep your other leg as straight as possible. Hold this position for __________ seconds. Slowly return to the starting position. Repeat with your other leg. Repeat __________ times. Complete this  exercise __________ times a day. Prone extension on elbows  Lie on your abdomen on a firm bed or the floor (prone position). Prop yourself up on your elbows. Use your arms to help lift your chest up until you feel a gentle stretch in your abdomen and your lower back. This will place some of your body weight on your elbows. If this is uncomfortable, try stacking pillows under your chest. Your hips should stay down, against  the surface that you are lying on. Keep your hip and back muscles relaxed. Hold this position for __________ seconds. Slowly relax your upper body and return to the starting position. Repeat __________ times. Complete this exercise __________ times a day. Strengthening exercises These exercises build strength and endurance in your back. Endurance is the ability to use your muscles for a long time, even after they get tired. Pelvic tilt This exercise strengthens the muscles that lie deep in the abdomen. Lie on your back on a firm bed or the floor with your legs extended. Bend your knees so they are pointing toward the ceiling and your feet are flat on the floor. Tighten your lower abdominal muscles to press your lower back against the floor. This motion will tilt your pelvis so your tailbone points up toward the ceiling instead of pointing to your feet or the floor. To help with this exercise, you may place a small towel under your lower back and try to push your back into the towel. Hold this position for __________ seconds. Let your muscles relax completely before you repeat this exercise. Repeat __________ times. Complete this exercise __________ times a day. Alternating arm and leg raises  Get on your hands and knees on a firm surface. If you are on a hard floor, you may want to use padding, such as an exercise mat, to cushion your knees. Line up your arms and legs. Your hands should be directly below your shoulders, and your knees should be directly below your hips. Lift your left leg behind you. At the same time, raise your right arm and straighten it in front of you. Do not lift your leg higher than your hip. Do not lift your arm higher than your shoulder. Keep your abdominal and back muscles tight. Keep your hips facing the ground. Do not arch your back. Keep your balance carefully, and do not hold your breath. Hold this position for __________ seconds. Slowly return to the starting  position. Repeat with your right leg and your left arm. Repeat __________ times. Complete this exercise __________ times a day. Abdominal set with straight leg raise  Lie on your back on a firm bed or the floor. Bend one of your knees and keep your other leg straight. Tense your abdominal muscles and lift your straight leg up, 4-6 inches (10-15 cm) off the ground. Keep your abdominal muscles tight and hold this position for __________ seconds. Do not hold your breath. Do not arch your back. Keep it flat against the ground. Keep your abdominal muscles tense as you slowly lower your leg back to the starting position. Repeat with your other leg. Repeat __________ times. Complete this exercise __________ times a day. Single leg lower with bent knees Lie on your back on a firm bed or the floor. Tense your abdominal muscles and lift your feet off the floor, one foot at a time, so your knees and hips are bent in 90-degree angles (right angles). Your knees should be over your hips and your lower  legs should be parallel to the floor. Keeping your abdominal muscles tense and your knee bent, slowly lower one of your legs so your toe touches the ground. Lift your leg back up to return to the starting position. Do not hold your breath. Do not let your back arch. Keep your back flat against the ground. Repeat with your other leg. Repeat __________ times. Complete this exercise __________ times a day. Posture and body mechanics Good posture and healthy body mechanics can help to relieve stress in your body's tissues and joints. Body mechanics refers to the movements and positions of your body while you do your daily activities. Posture is part of body mechanics. Good posture means: Your spine is in its natural S-curve position (neutral). Your shoulders are pulled back slightly. Your head is not tipped forward (neutral). Follow these guidelines to improve your posture and body mechanics in your everyday  activities. Standing  When standing, keep your spine neutral and your feet about hip-width apart. Keep a slight bend in your knees. Your ears, shoulders, and hips should line up. When you do a task in which you stand in one place for a long time, place one foot up on a stable object that is 2-4 inches (5-10 cm) high, such as a footstool. This helps keep your spine neutral. Sitting  When sitting, keep your spine neutral and keep your feet flat on the floor. Use a footrest, if necessary, and keep your thighs parallel to the floor. Avoid rounding your shoulders, and avoid tilting your head forward. When working at a desk or a computer, keep your desk at a height where your hands are slightly lower than your elbows. Slide your chair under your desk so you are close enough to maintain good posture. When working at a computer, place your monitor at a height where you are looking straight ahead and you do not have to tilt your head forward or downward to look at the screen. Resting When lying down and resting, avoid positions that are most painful for you. If you have pain with activities such as sitting, bending, stooping, or squatting, lie in a position in which your body does not bend very much. For example, avoid curling up on your side with your arms and knees near your chest (fetal position). If you have pain with activities such as standing for a long time or reaching with your arms, lie with your spine in a neutral position and bend your knees slightly. Try the following positions: Lying on your side with a pillow between your knees. Lying on your back with a pillow under your knees. Lifting  When lifting objects, keep your feet at least shoulder-width apart and tighten your abdominal muscles. Bend your knees and hips and keep your spine neutral. It is important to lift using the strength of your legs, not your back. Do not lock your knees straight out. Always ask for help to lift heavy or  awkward objects. This information is not intended to replace advice given to you by your health care provider. Make sure you discuss any questions you have with your health care provider. Document Revised: 12/04/2020 Document Reviewed: 12/04/2020 Elsevier Patient Education  2022 [employer] Iron-Rich Diet Iron is a mineral that helps your body produce hemoglobin. Hemoglobin is a protein in red blood cells that carries oxygen to your body's tissues. Eating too little iron may cause you to feel weak and tired, and it can increase your risk of infection. Iron is  naturally found in many foods, and many foods have iron added to them (are iron-fortified). You may need to follow an iron-rich diet if you do not have enough iron in your body due to certain medical conditions. The amount of iron that you need each day depends on your age, your sex, and any medical conditions you have. Follow instructions from your health care provider or a dietitian about how much iron you should eat each day. What are tips for following this plan? Reading food labels Check food labels to see how many milligrams (mg) of iron are in each serving. Cooking Cook foods in pots and pans that are made from iron. Take these steps to make it easier for your body to absorb iron from certain foods: Soak beans overnight before cooking. Soak whole grains overnight and drain them before using. Ferment flours before baking, such as by using yeast in bread dough. Meal planning When you eat foods that contain iron, you should eat them with foods that are high in vitamin C. These include oranges, peppers, tomatoes, potatoes, and mangoes. Vitamin C helps your body absorb iron. Certain foods and drinks prevent your body from absorbing iron properly. Avoid eating these foods in the same meal as iron-rich foods or with iron supplements. These foods include: Coffee, black tea, and red wine. Milk, dairy products, and foods that are high in  calcium. Beans and soybeans. Whole grains. General information Take iron supplements only as told by your health care provider. An overdose of iron can be life-threatening. If you were prescribed iron supplements, take them with orange juice or a vitamin C supplement. When you eat iron-fortified foods or take an iron supplement, you should also eat foods that naturally contain iron, such as meat, poultry, and fish. Eating naturally iron-rich foods helps your body absorb the iron that is added to other foods or contained in a supplement. Iron from animal sources is better absorbed than iron from plant sources. What foods should I eat? Fruits Prunes. Raisins. Eat fruits high in vitamin C, such as oranges, grapefruits, and strawberries, with iron-rich foods. Vegetables Spinach (cooked). Green peas. Broccoli. Fermented vegetables. Eat vegetables high in vitamin C, such as leafy greens, potatoes, bell peppers, and tomatoes, with iron-rich foods. Grains Iron-fortified breakfast cereal. Iron-fortified whole-wheat bread. Enriched rice. Sprouted grains. Meats and other proteins Beef liver. Beef. [place]. Chicken. Oysters. Shrimp. Tuna. Sardines. Chickpeas. Nuts. Tofu. Pumpkin seeds. Beverages Tomato juice. Fresh orange juice. Prune juice. Hibiscus tea. Iron-fortified instant breakfast shakes. Sweets and desserts Blackstrap molasses. Seasonings and condiments Tahini. Fermented soy sauce. Other foods Wheat germ. The items listed above may not be a complete list of recommended foods and beverages. Contact a dietitian for more information. What foods should I limit? These are foods that should be limited while eating iron-rich foods as they can reduce the absorption of iron in your body. Grains Whole grains. Bran cereal. Bran flour. Meats and other proteins Soybeans. Products made from soy protein. Black beans. Lentils. Mung beans. Split peas. Dairy Milk. Cream. Cheese. Yogurt. Cottage  cheese. Beverages Coffee. Black tea. Red wine. Sweets and desserts Cocoa. Chocolate. Ice cream. Seasonings and condiments Basil. Oregano. Large amounts of parsley. The items listed above may not be a complete list of foods and beverages you should limit. Contact a dietitian for more information. Summary Iron is a mineral that helps your body produce hemoglobin. Hemoglobin is a protein in red blood cells that carries oxygen to your body's tissues. Iron is naturally  found in many foods, and many foods have iron added to them (are iron-fortified). When you eat foods that contain iron, you should eat them with foods that are high in vitamin C. Vitamin C helps your body absorb iron. Certain foods and drinks prevent your body from absorbing iron properly, such as whole grains and dairy products. You should avoid eating these foods in the same meal as iron-rich foods or with iron supplements. This information is not intended to replace advice given to you by your health care provider. Make sure you discuss any questions you have with your health care provider. Document Revised: 08/28/2020 Document Reviewed: 08/28/2020 Elsevier Patient Education  2022 [employer].

## 2021-06-25 LAB — COMPREHENSIVE METABOLIC PANEL
ALT: 18 IU/L (ref 0–32)
AST: 16 IU/L (ref 0–40)
Albumin/Globulin Ratio: 2.1 (ref 1.2–2.2)
Albumin: 4.4 g/dL (ref 3.8–4.8)
Alkaline Phosphatase: 75 IU/L (ref 44–121)
BUN/Creatinine Ratio: 13 (ref 9–23)
BUN: 9 mg/dL (ref 6–20)
Bilirubin Total: 0.2 mg/dL (ref 0.0–1.2)
CO2: 23 mmol/L (ref 20–29)
Calcium: 8.3 mg/dL — ABNORMAL LOW (ref 8.7–10.2)
Chloride: 104 mmol/L (ref 96–106)
Creatinine, Ser: 0.69 mg/dL (ref 0.57–1.00)
Globulin, Total: 2.1 g/dL (ref 1.5–4.5)
Glucose: 88 mg/dL (ref 65–99)
Potassium: 4 mmol/L (ref 3.5–5.2)
Sodium: 143 mmol/L (ref 134–144)
Total Protein: 6.5 g/dL (ref 6.0–8.5)
eGFR: 117 mL/min/{1.73_m2} (ref >59)

## 2021-06-25 LAB — CBC WITH DIFFERENTIAL/PLATELET
Basophils Absolute: 0.1 10*3/uL (ref 0.0–0.2)
Basos: 1 %
EOS (ABSOLUTE): 0.2 10*3/uL (ref 0.0–0.4)
Eos: 2 %
Hematocrit: 39.9 % (ref 34.0–46.6)
Hemoglobin: 13.1 g/dL (ref 11.1–15.9)
Immature Grans (Abs): 0 10*3/uL (ref 0.0–0.1)
Immature Granulocytes: 0 %
Lymphocytes Absolute: 2.2 10*3/uL (ref 0.7–3.1)
Lymphs: 24 %
MCH: 31.4 pg (ref 26.6–33.0)
MCHC: 32.8 g/dL (ref 31.5–35.7)
MCV: 96 fL (ref 79–97)
Monocytes Absolute: 0.7 10*3/uL (ref 0.1–0.9)
Monocytes: 7 %
Neutrophils Absolute: 6.2 10*3/uL (ref 1.4–7.0)
Neutrophils: 66 %
Platelets: 296 10*3/uL (ref 150–450)
RBC: 4.17 x10E6/uL (ref 3.77–5.28)
RDW: 13.1 % (ref 11.7–15.4)
WBC: 9.4 10*3/uL (ref 3.4–10.8)

## 2021-07-19 ENCOUNTER — Other Ambulatory Visit: Payer: Self-pay

## 2021-07-19 DIAGNOSIS — F900 Attention-deficit hyperactivity disorder, predominantly inattentive type: Secondary | ICD-10-CM

## 2021-07-19 MED ORDER — AMPHETAMINE-DEXTROAMPHETAMINE 30 MG PO TABS: 30.0000 mg | ORAL_TABLET | Freq: Two times a day (BID) | ORAL | 0 refills | Status: DC

## 2021-08-17 ENCOUNTER — Other Ambulatory Visit: Payer: Self-pay

## 2021-08-17 DIAGNOSIS — F900 Attention-deficit hyperactivity disorder, predominantly inattentive type: Secondary | ICD-10-CM

## 2021-08-18 MED ORDER — AMPHETAMINE-DEXTROAMPHETAMINE 30 MG PO TABS: 30.0000 mg | ORAL_TABLET | Freq: Two times a day (BID) | ORAL | 0 refills | Status: DC

## 2021-08-20 ENCOUNTER — Encounter: Payer: Self-pay | Admitting: Nurse Practitioner

## 2021-08-20 ENCOUNTER — Ambulatory Visit: Payer: 59 | Admitting: Nurse Practitioner

## 2021-08-20 ENCOUNTER — Other Ambulatory Visit: Payer: Self-pay | Admitting: Nurse Practitioner

## 2021-08-20 VITALS — BP 132/68 | HR 87 | Temp 96.5°F | Ht 64.0 in | Wt 193.0 lb

## 2021-08-20 DIAGNOSIS — J0181 Other acute recurrent sinusitis: Secondary | ICD-10-CM | POA: Diagnosis not present

## 2021-08-20 DIAGNOSIS — F17219 Nicotine dependence, cigarettes, with unspecified nicotine-induced disorders: Secondary | ICD-10-CM

## 2021-08-20 DIAGNOSIS — R051 Acute cough: Secondary | ICD-10-CM | POA: Diagnosis not present

## 2021-08-20 DIAGNOSIS — J4520 Mild intermittent asthma, uncomplicated: Secondary | ICD-10-CM

## 2021-08-20 DIAGNOSIS — F900 Attention-deficit hyperactivity disorder, predominantly inattentive type: Secondary | ICD-10-CM

## 2021-08-20 DIAGNOSIS — J029 Acute pharyngitis, unspecified: Secondary | ICD-10-CM | POA: Diagnosis not present

## 2021-08-20 LAB — POC INFLUENZA A&B (BINAX/QUICKVUE)
Influenza A, POC: NEGATIVE
Influenza B, POC: NEGATIVE

## 2021-08-20 LAB — POC COVID19 BINAXNOW: SARS Coronavirus 2 Ag: NEGATIVE

## 2021-08-20 MED ORDER — PROMETHAZINE-DM 6.25-15 MG/5ML PO SYRP: 5.0000 mL | ORAL_SOLUTION | Freq: Four times a day (QID) | ORAL | 0 refills | Status: AC | PRN

## 2021-08-20 MED ORDER — AMPHETAMINE-DEXTROAMPHETAMINE 30 MG PO TABS: 30.0000 mg | ORAL_TABLET | Freq: Two times a day (BID) | ORAL | 0 refills | Status: DC

## 2021-08-20 MED ORDER — FLUTICASONE PROPIONATE 50 MCG/ACT NA SUSP: 2.0000 | Freq: Every day | NASAL | 6 refills | Status: AC

## 2021-08-20 MED ORDER — AZITHROMYCIN 250 MG PO TABS: ORAL_TABLET | ORAL | 0 refills | Status: AC

## 2021-08-20 NOTE — Progress Notes (Signed)
Acute Office Visit  Subjective:    Patient ID: Julia Humphrey, female    DOB: 1985-11-01, 35 y.o.   MRN: [id number]  CC: Sore throat  HPI Patient is in today for sore throat, fever, and cough. Onset of symptoms was 3 days ago. Treatment has included Mucinex, Dayquil, and Nyquil. She has a past history of chronic allergic rhinitis and asthma with long-term cigarette smoking. She has not obtained COVID-19 or seasonal flu vaccines.      Past Medical History:  Diagnosis Date   ADHD    Anxiety    Asthma    Depression    Rheumatoid arthritis ([place])     Past Surgical History:  Procedure Laterality Date   TUBAL LIGATION      Family History  Problem Relation Age of Onset   Rheum arthritis Mother    Hypertension Mother    Brain cancer Father    Hypertension Sister    Breast cancer Maternal Grandmother     Social History   Socioeconomic History   Marital status: Single    Spouse name: Not on file   Number of children: 3   Years of education: Not on file   Highest education level: High school graduate  Occupational History   Not on file  Tobacco Use   Smoking status: Every Day    Packs/day: 0.50    Types: Cigarettes   Smokeless tobacco: Never   Tobacco comments:    began smoking at age 23  Vaping Use   Vaping Use: Some days  Substance and Sexual Activity   Alcohol use: Yes    Comment: occasional   Drug use: Not Currently    Comment: 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   Sexual activity: Not Currently    Birth control/protection: Pill  Other Topics Concern   Not on file  Social History Narrative   Not on file   Social Determinants of Health   Financial Resource Strain: Not on file  Food Insecurity: Not on file  Transportation Needs: No Transportation Needs   Lack of Transportation (Medical): No   Lack of Transportation (Non-Medical): No  Physical Activity: Not on file  Stress: Not on file  Social Connections: Not on file   Intimate Partner Violence: Not on file    Outpatient Medications Prior to Visit  Medication Sig Dispense Refill   albuterol (VENTOLIN HFA) 108 (90 Base) MCG/ACT inhaler Inhale 1-2 puffs into the lungs every 6 (six) hours as needed for wheezing or shortness of breath. 1 each 1   amphetamine-dextroamphetamine (ADDERALL) 30 MG tablet Take 1 tablet by mouth 2 (two) times daily. 60 tablet 0   Fluticasone-Umeclidin-Vilant (TRELEGY ELLIPTA) 200-62.5-25 MCG/INH AEPB Inhale 1 puff into the lungs daily. 2 each 0   hydrOXYzine (VISTARIL) 100 MG capsule Take 1 capsule (100 mg total) by mouth 3 (three) times daily as needed for itching. 30 capsule 0   VITAMIN D PO Take by mouth.     No facility-administered medications prior to visit.    Allergies  Allergen Reactions   Amoxicillin Anaphylaxis   Penicillins Anaphylaxis    Review of Systems  Constitutional:  Negative for chills, fatigue and fever.  HENT:  Positive for congestion, rhinorrhea and sore throat. Negative for ear pain, postnasal drip, sinus pressure and sinus pain.   Respiratory:  Positive for cough. Negative for shortness of breath.   Cardiovascular:  Negative for chest pain.  Gastrointestinal:  Negative for diarrhea and nausea.  Neurological:  Negative for dizziness and headaches.      Objective:    Physical Exam Vitals reviewed.  Constitutional:      Appearance: Normal appearance.  HENT:     Head: Normocephalic.     Right Ear: Tympanic membrane normal.     Left Ear: Tympanic membrane normal.     Nose: Congestion and rhinorrhea present.     Mouth/Throat:     Mouth: Mucous membranes are dry.     Pharynx: Posterior oropharyngeal erythema present.  Cardiovascular:     Rate and Rhythm: Normal rate and regular rhythm.     Pulses: Normal pulses.     Heart sounds: Normal heart sounds.  Pulmonary:     Effort: Pulmonary effort is normal.     Breath sounds: Normal breath sounds.  Abdominal:     General: Bowel sounds are  normal.     Palpations: Abdomen is soft.  Musculoskeletal:        General: Normal range of motion.     Cervical back: Neck supple.  Skin:    General: Skin is warm and dry.     Capillary Refill: Capillary refill takes less than 2 seconds.  Neurological:     General: No focal deficit present.     Mental Status: She is alert and oriented to person, place, and time.  Psychiatric:        Mood and Affect: Mood normal.        Behavior: Behavior normal.    BP 132/68   Pulse 87   Temp (!) 96.5 F (35.8 C)   Ht _0  (1.626 m)   Wt 193 lb (87.5 kg)   SpO2 99%   BMI 33.13 kg/m   Wt Readings from Last 3 Encounters:  06/22/21 184 lb (83.5 kg)  03/21/21 183 lb (83 kg)  11/01/20 203 lb (92.1 kg)    Health Maintenance Due  Topic Date Due   COVID-19 Vaccine (1) Never done   Pneumococcal Vaccine 47-74 Years old (1 - PCV) Never done   HIV Screening  Never done   Hepatitis C Screening  Never done   TETANUS/TDAP  Never done   PAP SMEAR-Modifier  Never done   INFLUENZA VACCINE  Never done       Lab Results  Component Value Date   TSH 2.090 11/01/2020   Lab Results  Component Value Date   WBC 9.4 06/22/2021   HGB 13.1 06/22/2021   HCT 39.9 06/22/2021   MCV 96 06/22/2021   PLT 296 06/22/2021   Lab Results  Component Value Date   NA 143 06/22/2021   K 4.0 06/22/2021   CO2 23 06/22/2021   GLUCOSE 88 06/22/2021   BUN 9 06/22/2021   CREATININE 0.69 06/22/2021   BILITOT 0.2 06/22/2021   ALKPHOS 75 06/22/2021   AST 16 06/22/2021   ALT 18 06/22/2021   PROT 6.5 06/22/2021   ALBUMIN 4.4 06/22/2021   CALCIUM 8.3 (L) 06/22/2021   EGFR 117 06/22/2021      Assessment & Plan:   1. Other acute recurrent sinusitis - azithromycin (ZITHROMAX) 250 MG tablet; Take 2 tablets on day 1, then 1 tablet daily on days 2 through 5  Dispense: 6 tablet; Refill: 0 - fluticasone (FLONASE) 50 MCG/ACT nasal spray; Place 2 sprays into both nostrils daily.  Dispense: 16 g; Refill: 6  2. Sore  throat - POC COVID-19 - POC Influenza A&B (Binax test)  3. Acute cough - POC COVID-19 - POC Influenza A&B (Binax  test) - promethazine-dextromethorphan (PROMETHAZINE-DM) 6.25-15 MG/5ML syrup; Take 5 mLs by mouth 4 (four) times daily as needed.  Dispense: 118 mL; Refill: 0  4. Cigarette nicotine dependence with nicotine-induced disorder -recommend cigarette smoking cessation  5. Mild intermittent asthma without complication  -seek emergency medical care for severe worsening of symptoms, shortness of breath, or any other serious health concerns    Take Zpack Replace toothbrushes and toothpaste Rest and push fluids Take cough syrup up to four times daily Use Flonase nasal spray daily  Follow-up: PRN  I, Rip Harbour, NP, have reviewed all documentation for this visit. The documentation on 08/20/21 for the exam, diagnosis, procedures, and orders are all accurate and complete.    Signed, Rip Harbour, NP

## 2021-08-20 NOTE — Patient Instructions (Signed)
Take Zpack Replace toothbrushes and toothpaste Rest and push fluids Take cough syrup up to four times daily Use Flonase nasal spray daily    Sinusitis, Adult Sinusitis is soreness and swelling (inflammation) of your sinuses. Sinuses are hollow spaces in the bones around your face. They are located: Around your eyes. In the middle of your forehead. Behind your nose. In your cheekbones. Your sinuses and nasal passages are lined with a fluid called mucus. Mucus drains out of your sinuses. Swelling can trap mucus in your sinuses. This lets germs (bacteria, virus, or fungus) grow, which leads to infection. Most of the time, this condition is caused by a virus. What are the causes? This condition is caused by: Allergies. Asthma. Germs. Things that block your nose or sinuses. Growths in the nose (nasal polyps). Chemicals or irritants in the air. Fungus (rare). What increases the risk? You are more likely to develop this condition if: You have a weak body defense system (immune system). You do a lot of swimming or diving. You use nasal sprays too much. You smoke. What are the signs or symptoms? The main symptoms of this condition are pain and a feeling of pressure around the sinuses. Other symptoms include: Stuffy nose (congestion). Runny nose (drainage). Swelling and warmth in the sinuses. Headache. Toothache. A cough that may get worse at night. Mucus that collects in the throat or the back of the nose (postnasal drip). Being unable to smell and taste. Being very tired (fatigue). A fever. Sore throat. Bad breath. How is this diagnosed? This condition is diagnosed based on: Your symptoms. Your medical history. A physical exam. Tests to find out if your condition is short-term (acute) or long-term (chronic). Your doctor may: Check your nose for growths (polyps). Check your sinuses using a tool that has a light (endoscope). Check for allergies or germs. Do imaging tests,  such as an MRI or CT scan. How is this treated? Treatment for this condition depends on the cause and whether it is short-term or long-term. If caused by a virus, your symptoms should go away on their own within 10 days. You may be given medicines to relieve symptoms. They include: Medicines that shrink swollen tissue in the nose. Medicines that treat allergies (antihistamines). A spray that treats swelling of the nostrils.  Rinses that help get rid of thick mucus in your nose (nasal saline washes). If caused by bacteria, your doctor may wait to see if you will get better without treatment. You may be given antibiotic medicine if you have: A very bad infection. A weak body defense system. If caused by growths in the nose, you may need to have surgery. Follow these instructions at home: Medicines Take, use, or apply over-the-counter and prescription medicines only as told by your doctor. These may include nasal sprays. If you were prescribed an antibiotic medicine, take it as told by your doctor. Do not stop taking the antibiotic even if you start to feel better. Hydrate and humidify  Drink enough water to keep your pee (urine) pale yellow. Use a cool mist humidifier to keep the humidity level in your home above 50%. Breathe in steam for 10-15 minutes, 3-4 times a day, or as told by your doctor. You can do this in the bathroom while a hot shower is running. Try not to spend time in cool or dry air. Rest Rest as much as you can. Sleep with your head raised (elevated). Make sure you get enough sleep each night. General instructions  Put a warm, moist washcloth on your face 3-4 times a day, or as often as told by your doctor. This will help with discomfort. Wash your hands often with soap and water. If there is no soap and water, use hand sanitizer. Do not smoke. Avoid being around people who are smoking (secondhand smoke). Keep all follow-up visits as told by your doctor. This is  important. Contact a doctor if: You have a fever. Your symptoms get worse. Your symptoms do not get better within 10 days. Get help right away if: You have a very bad headache. You cannot stop throwing up (vomiting). You have very bad pain or swelling around your face or eyes. You have trouble seeing. You feel confused. Your neck is stiff. You have trouble breathing. Summary Sinusitis is swelling of your sinuses. Sinuses are hollow spaces in the bones around your face. This condition is caused by tissues in your nose that become inflamed or swollen. This traps germs. These can lead to infection. If you were prescribed an antibiotic medicine, take it as told by your doctor. Do not stop taking it even if you start to feel better. Keep all follow-up visits as told by your doctor. This is important. This information is not intended to replace advice given to you by your health care provider. Make sure you discuss any questions you have with your health care provider. Document Revised: 02/16/2018 Document Reviewed: 02/16/2018 Elsevier Patient Education  2022 [employer].

## 2021-08-21 ENCOUNTER — Telehealth: Payer: Self-pay

## 2021-08-21 ENCOUNTER — Other Ambulatory Visit: Payer: Self-pay | Admitting: Nurse Practitioner

## 2021-08-21 DIAGNOSIS — F900 Attention-deficit hyperactivity disorder, predominantly inattentive type: Secondary | ICD-10-CM

## 2021-08-21 MED ORDER — AMPHETAMINE-DEXTROAMPHETAMINE 30 MG PO TABS: 30.0000 mg | ORAL_TABLET | Freq: Two times a day (BID) | ORAL | 0 refills | Status: DC

## 2021-08-21 NOTE — Telephone Encounter (Signed)
Patient calling as she has called multiple pharmacies for supply of adderall. Only pharmacy she found is [facility] with 30 tabs but if she fills this 15 day supply under a 30 day script she will forfeit the only 30 tabs. She is upset and plans to leave to go out of state after lunch today. She is questioning what she can do. Asked if possibly she could get the 15 day supply @ [facility] but with a script written for 15 days so she can get 15 days after.   Lorita Officer, CCMA 08/21/21 11:50 AM

## 2021-09-18 ENCOUNTER — Other Ambulatory Visit: Payer: Self-pay

## 2021-09-18 DIAGNOSIS — F900 Attention-deficit hyperactivity disorder, predominantly inattentive type: Secondary | ICD-10-CM

## 2021-09-18 MED ORDER — AMPHETAMINE-DEXTROAMPHETAMINE 30 MG PO TABS: 30.0000 mg | ORAL_TABLET | Freq: Two times a day (BID) | ORAL | 0 refills | Status: DC

## 2021-09-27 ENCOUNTER — Ambulatory Visit: Payer: 59 | Admitting: Nurse Practitioner

## 2021-10-04 ENCOUNTER — Ambulatory Visit (INDEPENDENT_AMBULATORY_CARE_PROVIDER_SITE_OTHER): Payer: 59 | Admitting: Nurse Practitioner

## 2021-10-04 ENCOUNTER — Encounter: Payer: Self-pay | Admitting: Nurse Practitioner

## 2021-10-04 ENCOUNTER — Other Ambulatory Visit: Payer: Self-pay

## 2021-10-04 VITALS — BP 120/80 | HR 89 | Temp 97.2°F | Ht 65.0 in | Wt 192.0 lb

## 2021-10-04 DIAGNOSIS — J4541 Moderate persistent asthma with (acute) exacerbation: Secondary | ICD-10-CM | POA: Diagnosis not present

## 2021-10-04 DIAGNOSIS — F900 Attention-deficit hyperactivity disorder, predominantly inattentive type: Secondary | ICD-10-CM | POA: Diagnosis not present

## 2021-10-04 DIAGNOSIS — F17219 Nicotine dependence, cigarettes, with unspecified nicotine-induced disorders: Secondary | ICD-10-CM

## 2021-10-04 DIAGNOSIS — Z8619 Personal history of other infectious and parasitic diseases: Secondary | ICD-10-CM | POA: Diagnosis not present

## 2021-10-04 DIAGNOSIS — M79604 Pain in right leg: Secondary | ICD-10-CM

## 2021-10-04 DIAGNOSIS — M79605 Pain in left leg: Secondary | ICD-10-CM

## 2021-10-04 DIAGNOSIS — G8929 Other chronic pain: Secondary | ICD-10-CM

## 2021-10-04 MED ORDER — FLUCONAZOLE 150 MG PO TABS: 150.0000 mg | ORAL_TABLET | Freq: Once | ORAL | 0 refills | Status: AC

## 2021-10-04 MED ORDER — HYDROXYCHLOROQUINE SULFATE 200 MG PO TABS: 400.0000 mg | ORAL_TABLET | Freq: Every day | ORAL | 1 refills | Status: DC

## 2021-10-04 MED ORDER — AZITHROMYCIN 250 MG PO TABS: ORAL_TABLET | ORAL | 0 refills | Status: AC

## 2021-10-04 NOTE — Progress Notes (Signed)
Subjective:  Patient ID: Julia Humphrey, female    DOB: May 11, 1986  Age: 36 y.o. MRN: [id number]  Chief Complaint  Patient presents with   Depression   ADHD    HPI  Julia Humphrey is a 36 year old Caucasian female that presents for follow-up of asthma and ADHD. She tells me she was prescribed Plaquenil 400 mg daily for bilateral chronic left pain when she lived in [place] by a rheumatologist. She has requested a refill of this medication.   Asthma Julia Humphrey Kafi Dotter has has a life-long history of asthma. She  states symptoms are well-controlled currently with Trelegy and Albuterol inhalers/nebulizers as needed. She  denies recent hospitalizations or exacerbations of asthma.She is an active cigarette smoker, states she has decreased to three daily.     ADHD Julia Humphrey was diagnosed with adult ADHD, predominantly inattentive type, several years ago. Current treatment is Adderall 30 mg BID. States her symptoms are currently well-controlled.Denies side effects of treatment.  Current Outpatient Medications on File Prior to Visit  Medication Sig Dispense Refill   albuterol (VENTOLIN HFA) 108 (90 Base) MCG/ACT inhaler Inhale 1-2 puffs into the lungs every 6 (six) hours as needed for wheezing or shortness of breath. 1 each 1   amphetamine-dextroamphetamine (ADDERALL) 30 MG tablet Take 1 tablet by mouth 2 (two) times daily. 60 tablet 0   fluticasone (FLONASE) 50 MCG/ACT nasal spray Place 2 sprays into both nostrils daily. 16 g 6   Fluticasone-Umeclidin-Vilant (TRELEGY ELLIPTA) 200-62.5-25 MCG/INH AEPB Inhale 1 puff into the lungs daily. 2 each 0   hydrOXYzine (VISTARIL) 100 MG capsule Take 1 capsule (100 mg total) by mouth 3 (three) times daily as needed for itching. 30 capsule 0   promethazine-dextromethorphan (PROMETHAZINE-DM) 6.25-15 MG/5ML syrup Take 5 mLs by mouth 4 (four) times daily as needed. 118 mL 0   VITAMIN D PO Take by mouth.     No current facility-administered medications on  file prior to visit.   Past Medical History:  Diagnosis Date   ADHD    Anxiety    Asthma    Depression    Rheumatoid arthritis (HCC)    Past Surgical History:  Procedure Laterality Date   TUBAL LIGATION      Family History  Problem Relation Age of Onset   Rheum arthritis Mother    Hypertension Mother    Brain cancer Father    Hypertension Sister    Breast cancer Maternal Grandmother    Social History   Socioeconomic History   Marital status: Single    Spouse name: Not on file   Number of children: 3   Years of education: Not on file   Highest education level: High school graduate  Occupational History   Not on file  Tobacco Use   Smoking status: Every Day    Packs/day: 0.50    Types: Cigarettes   Smokeless tobacco: Never   Tobacco comments:    began smoking at age 20  Vaping Use   Vaping Use: Some days  Substance and Sexual Activity   Alcohol use: Yes    Comment: occasional   Drug use: Not Currently    Comment: 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   Sexual activity: Not Currently    Birth control/protection: Pill  Other Topics Concern   Not on file  Social History Narrative   Not on file   Social Determinants of Health   Financial Resource Strain: Not on file  Food Insecurity: Not on file  Transportation Needs:  Not on file  Physical Activity: Not on file  Stress: Not on file  Social Connections: Not on file    Review of Systems  Constitutional:  Negative for chills, fatigue and fever.  HENT:  Positive for ear pain (Right). Negative for congestion, rhinorrhea and sore throat.   Eyes: Negative.   Respiratory:  Positive for cough and wheezing. Negative for chest tightness and shortness of breath.   Cardiovascular:  Negative for chest pain.  Gastrointestinal:  Negative for abdominal pain, constipation, diarrhea, nausea and vomiting.  Endocrine: Negative.   Genitourinary:  Negative for dysuria and urgency.  Musculoskeletal:  Positive  for arthralgias (bilateral legs). Negative for back pain and myalgias.  Allergic/Immunologic: Negative.   Neurological:  Negative for dizziness, weakness, light-headedness and headaches.  Hematological: Negative.   Psychiatric/Behavioral:  Negative for dysphoric mood. The patient is not nervous/anxious.     Objective:  Pulse 89    Temp (!) 97.2 F (36.2 C)    Ht 5\' 5"  (1.651 m)    Wt 192 lb (87.1 kg)    SpO2 99%    BMI 31.95 kg/m  BP 120/80    Pulse 89    Temp (!) 97.2 F (36.2 C)    Ht 5\' 5"  (1.651 m)    Wt 192 lb (87.1 kg)    LMP 09/30/2021    SpO2 99%    BMI 31.95 kg/m    BP/Weight 10/04/2021 08/20/2021 06/22/2021  Systolic BP - 132 122  Diastolic BP - 68 72  Wt. (Lbs) 192 193 184  BMI 31.95 33.13 31.58    Physical Exam Vitals reviewed.  Constitutional:      Appearance: Normal appearance.  HENT:     Head: Normocephalic.     Right Ear: Tenderness present. Tympanic membrane is erythematous.     Left Ear: Tympanic membrane normal.     Nose: Nose normal.     Mouth/Throat:     Mouth: Mucous membranes are moist.  Eyes:     Pupils: Pupils are equal, round, and reactive to light.  Cardiovascular:     Rate and Rhythm: Normal rate and regular rhythm.     Pulses: Normal pulses.     Heart sounds: Normal heart sounds.  Pulmonary:     Effort: Pulmonary effort is normal.     Breath sounds: Wheezing present.  Abdominal:     General: Bowel sounds are normal.     Palpations: Abdomen is soft.  Musculoskeletal:        General: Normal range of motion.  Skin:    General: Skin is warm and dry.     Capillary Refill: Capillary refill takes less than 2 seconds.  Neurological:     General: No focal deficit present.     Mental Status: She is alert and oriented to person, place, and time.  Psychiatric:        Mood and Affect: Mood normal.        Behavior: Behavior normal.     Lab Results  Component Value Date   WBC 9.4 06/22/2021   HGB 13.1 06/22/2021   HCT 39.9 06/22/2021   PLT 296  06/22/2021   GLUCOSE 88 06/22/2021   ALT 18 06/22/2021   AST 16 06/22/2021   NA 143 06/22/2021   K 4.0 06/22/2021   CL 104 06/22/2021   CREATININE 0.69 06/22/2021   BUN 9 06/22/2021   CO2 23 06/22/2021   TSH 2.090 11/01/2020      Assessment & Plan:  1. Attention deficit hyperactivity disorder (ADHD), predominantly inattentive type - CBC with Differential/Platelet - Comprehensive metabolic panel - Lipid panel - TSH  2. Chronic pain of both lower extremities - hydroxychloroquine (PLAQUENIL) 200 MG tablet; Take 2 tablets (400 mg total) by mouth daily. Take with food or milk  Dispense: 180 tablet; Refill: 1  3. Moderate persistent asthma with acute exacerbation - azithromycin (ZITHROMAX) 250 MG tablet; Take 2 tablets on day 1, then 1 tablet daily on days 2 through 5  Dispense: 6 tablet; Refill: 0  4. History of candidiasis of vagina - fluconazole (DIFLUCAN) 150 MG tablet; Take 1 tablet (150 mg total) by mouth once for 1 dose.  Dispense: 1 tablet; Refill: 0  5. Cigarette nicotine dependence with nicotine-induced disorder    Continue medications Resume Plaquenil 200 mg , two tablets daily by mouth Take Z-pack as directed Take Diflucan 150 mg after completing Z-pack Consider setting quit date for smoking Follow-up in 463-months    Follow-up: 893-months  An After Visit Summary was printed and given to the patient.  I, Janie MorningShannon J Carroll Ranney, NP, have reviewed all documentation for this visit. The documentation on 10/04/21 for the exam, diagnosis, procedures, and orders are all accurate and complete.    Signed, Janie MorningShannon J Canon Gola, NP [facility] [phone]

## 2021-10-04 NOTE — Patient Instructions (Signed)
Continue medications Resume Plaquenil 200 mg , two tablets daily by mouth Take Z-pack as directed Take Diflucan 150 mg after completing Z-pack Consider setting quit date for smoking Follow-up in 543-months   Managing the Challenge of Quitting Smoking Quitting smoking is a physical and mental challenge. You will face cravings, withdrawal symptoms, and temptation. Before quitting, work with your health care provider to make a plan that can help you manage quitting. Preparation can help you quit and keep you from giving in. How to manage lifestyle changes Managing stress Stress can make you want to smoke, and wanting to smoke may cause stress. It is important to find ways to manage your stress. You might try some of the following: Practice relaxation techniques. Breathe slowly and deeply, in through your nose and out through your mouth. Listen to music. Soak in a bath or take a shower. Imagine a peaceful place or vacation. Get some support. Talk with family or friends about your stress. Join a support group. Talk with a counselor or therapist. Get some physical activity. Go for a walk, run, or bike ride. Play a favorite sport. Practice yoga.  Medicines Talk with your health care provider about medicines that might help you deal with cravings and make quitting easier for you. Relationships Social situations can be difficult when you are quitting smoking. To manage this, you can: Avoid parties and other social situations where people might be smoking. Avoid alcohol. Leave right away if you have the urge to smoke. Explain to your family and friends that you are quitting smoking. Ask for support and let them know you might be a bit grumpy. Plan activities where smoking is not an option. General instructions Be aware that many people Brandt weight after they quit smoking. However, not everyone does. To keep from gaining weight, have a plan in place before you quit and stick to the plan after  you quit. Your plan should include: Having healthy snacks. When you have a craving, it may help to: Eat popcorn, carrots, celery, or other cut vegetables. Chew sugar-free gum. Changing how you eat. Eat small portion sizes at meals. Eat 4-6 small meals throughout the day instead of 1-2 large meals a day. Be mindful when you eat. Do not watch television or do other things that might distract you as you eat. Exercising regularly. Make time to exercise each day. If you do not have time for a long workout, do short bouts of exercise for 5-10 minutes several times a day. Do some form of strengthening exercise, such as weight lifting. Do some exercise that gets your heart beating and causes you to breathe deeply, such as walking fast, running, swimming, or biking. This is very important. Drinking plenty of water or other low-calorie or no-calorie drinks. Drink 6-8 glasses of water daily.  How to recognize withdrawal symptoms Your body and mind may experience discomfort as you try to get used to not having nicotine in your system. These effects are called withdrawal symptoms. They may include: Feeling hungrier than normal. Having trouble concentrating. Feeling irritable or restless. Having trouble sleeping. Feeling depressed. Craving a cigarette. To manage withdrawal symptoms: Avoid places, people, and activities that trigger your cravings. Remember why you want to quit. Get plenty of sleep. Avoid coffee and other caffeinated drinks. These may worsen some of your symptoms. These symptoms may surprise you. But be assured that they are normal to have when quitting smoking. How to manage cravings Come up with a plan for how to  deal with your cravings. The plan should include the following: A definition of the specific situation you want to deal with. An alternative action you will take. A clear idea for how this action will help. The name of someone who might help you with this. Cravings  usually last for 5-10 minutes. Consider taking the following actions to help you with your plan to deal with cravings: Keep your mouth busy. Chew sugar-free gum. Suck on hard candies or a straw. Brush your teeth. Keep your hands and body busy. Change to a different activity right away. Squeeze or play with a ball. Do an activity or a hobby, such as making bead jewelry, practicing needlepoint, or working with wood. Mix up your normal routine. Take a short exercise break. Go for a quick walk or run up and down stairs. Focus on doing something kind or helpful for someone else. Call a friend or family member to talk during a craving. Join a support group. Contact a quitline. Where to find support To get help or find a support group: Call the National Cancer Institute's Smoking Quitline: 1-800-QUIT NOW (657) 779-5862) Visit the website of the Substance Abuse and Mental Health Services Administration: SkateOasis.com.pt Text QUIT to SmokefreeTXT: 144315 Where to find more information Visit these websites to find more information on quitting smoking: National Cancer Institute: www.smokefree.gov American Lung Association: www.lung.org American Cancer Society: www.cancer.org Centers for Disease Control and Prevention: FootballExhibition.com.br American Heart Association: www.heart.org Contact a health care provider if: You want to change your plan for quitting. The medicines you are taking are not helping. Your eating feels out of control or you cannot sleep. Get help right away if: You feel depressed or become very anxious. Summary Quitting smoking is a physical and mental challenge. You will face cravings, withdrawal symptoms, and temptation to smoke again. Preparation can help you as you go through these challenges. Try different techniques to manage stress, handle social situations, and prevent weight [place]. You can deal with cravings by keeping your mouth busy (such as by chewing gum), keeping your hands and  body busy, calling family or friends, or contacting a quitline for people who want to quit smoking. You can deal with withdrawal symptoms by avoiding places where people smoke, getting plenty of rest, and avoiding drinks with caffeine. This information is not intended to replace advice given to you by your health care provider. Make sure you discuss any questions you have with your health care provider. Document Revised: 05/25/2021 Document Reviewed: 07/06/2019 Elsevier Patient Education  2022 Elsevier Inc. Asthma Action Plan, Adult An asthma action plan helps you understand how to manage your asthma and what to do when you have an asthma attack. The action plan is a color-coded plan that lists the symptoms that indicate whether or not your condition is under control and what actions to take. If you have symptoms in the green zone, you are doing well. If you have symptoms in the yellow zone, you are having problems. If you have symptoms in the red zone, you need medical care right away. Follow the plan that you and your health care provider develop. Review your plan with your health care provider at each visit. What triggers your asthma? Knowing the things that can trigger an asthma attack or make your asthma symptoms worse is very important. Talk to your health care provider about your asthma triggers and how to avoid them. Record your known asthma triggers here: _______________ What is your personal best peak flow reading? If  you use a peak flow meter, determine your personal best reading. Record it here: _______________ [place] zone This zone means that your asthma is under control. You may not have any symptoms while you are in the green zone. This means that you: Have no coughing or wheezing, even while you are working or playing. Sleep through the night. Are breathing well. Have a peak flow reading that is above __________ (80% of your personal best or greater). If you are in the green  zone, continue to manage your asthma as directed. Take these medicines every day: Controller medicine and dosage: _______________ Controller medicine and dosage: _______________ Controller medicine and dosage: _______________ Controller medicine and dosage: _______________ Before exercise, use this reliever or rescue medicine: _______________ Call your health care provider if you are using a reliever or rescue medicine more than 2-3 times a week. Yellow zone Symptoms in this zone mean that your condition may be getting worse. You may have symptoms that interfere with exercise, are noticeably worse after exposure to triggers, or are worse at the first sign of a cold (upper respiratory infection). These may include: Waking from sleep. Coughing, especially at night or first thing in the morning. Mild wheezing. Chest tightness. A peak flow reading that is __________ to __________ (50-79% of your personal best). If you have any of these symptoms: Add the following medicine to the ones that you use daily: Reliever or rescue medicine and dosage: _______________ Additional medicine and dosage: _______________ Call your health care provider if: You remain in the yellow zone for __________ hours. You are using a reliever or rescue medicine more than 2-3 times a week. Red zone Symptoms in this zone mean that you should get medical help right away. You will likely feel distressed and have symptoms at rest that restrict your activity. You are in the red zone if: You are breathing hard and quickly. Your nose opens wide, your ribs show, and your neck muscles become visible when you breathe in. Your lips, fingers, or toes are a bluish color. You have trouble speaking in full sentences. Your peak flow reading is less than __________ (less than 50% of your personal best). Your symptoms do not improve within 15-20 minutes after you use your reliever or rescue medicine (bronchodilator). If you have any of  these symptoms: These symptoms represent a serious problem that is an emergency. Do not wait to see if the symptoms will go away. Get medical help right away. Call your local emergency services (911 in the [place] Do not drive yourself to the hospital. Use your reliever or rescue medicine. Start a nebulizer treatment or take 2-4 puffs from a metered-dose inhaler with a spacer. Repeat this action every 15-20 minutes until help arrives. Where to find more information You can find more information about asthma from: Centers for Disease Control and Prevention: FootballExhibition.com.br American Lung Association: www.lung.org This information is not intended to replace advice given to you by your health care provider. Make sure you discuss any questions you have with your health care provider. Document Revised: 11/09/2020 Document Reviewed: 11/09/2020 Elsevier Patient Education  2022 [employer].

## 2021-10-05 LAB — CBC WITH DIFFERENTIAL/PLATELET
Basophils Absolute: 0 10*3/uL (ref 0.0–0.2)
Basos: 1 %
EOS (ABSOLUTE): 0.2 10*3/uL (ref 0.0–0.4)
Eos: 4 %
Hematocrit: 40.3 % (ref 34.0–46.6)
Hemoglobin: 13.5 g/dL (ref 11.1–15.9)
Immature Grans (Abs): 0 10*3/uL (ref 0.0–0.1)
Immature Granulocytes: 0 %
Lymphocytes Absolute: 1.5 10*3/uL (ref 0.7–3.1)
Lymphs: 28 %
MCH: 30.8 pg (ref 26.6–33.0)
MCHC: 33.5 g/dL (ref 31.5–35.7)
MCV: 92 fL (ref 79–97)
Monocytes Absolute: 0.5 10*3/uL (ref 0.1–0.9)
Monocytes: 10 %
Neutrophils Absolute: 3 10*3/uL (ref 1.4–7.0)
Neutrophils: 57 %
Platelets: 287 10*3/uL (ref 150–450)
RBC: 4.39 x10E6/uL (ref 3.77–5.28)
RDW: 13.6 % (ref 11.7–15.4)
WBC: 5.2 10*3/uL (ref 3.4–10.8)

## 2021-10-05 LAB — COMPREHENSIVE METABOLIC PANEL
ALT: 44 IU/L — ABNORMAL HIGH (ref 0–32)
AST: 38 IU/L (ref 0–40)
Albumin/Globulin Ratio: 2 (ref 1.2–2.2)
Albumin: 4.2 g/dL (ref 3.8–4.8)
Alkaline Phosphatase: 62 IU/L (ref 44–121)
BUN/Creatinine Ratio: 18 (ref 9–23)
BUN: 12 mg/dL (ref 6–20)
Bilirubin Total: 0.3 mg/dL (ref 0.0–1.2)
CO2: 24 mmol/L (ref 20–29)
Calcium: 8.5 mg/dL — ABNORMAL LOW (ref 8.7–10.2)
Chloride: 99 mmol/L (ref 96–106)
Creatinine, Ser: 0.68 mg/dL (ref 0.57–1.00)
Globulin, Total: 2.1 g/dL (ref 1.5–4.5)
Glucose: 85 mg/dL (ref 70–99)
Potassium: 4.9 mmol/L (ref 3.5–5.2)
Sodium: 138 mmol/L (ref 134–144)
Total Protein: 6.3 g/dL (ref 6.0–8.5)
eGFR: 116 mL/min/{1.73_m2} (ref >59)

## 2021-10-05 LAB — LIPID PANEL
Chol/HDL Ratio: 2.8 ratio (ref 0.0–4.4)
Cholesterol, Total: 160 mg/dL (ref 100–199)
HDL: 58 mg/dL (ref >39)
LDL Chol Calc (NIH): 92 mg/dL (ref 0–99)
Triglycerides: 44 mg/dL (ref 0–149)
VLDL Cholesterol Cal: 10 mg/dL (ref 5–40)

## 2021-10-05 LAB — TSH: TSH: 2.53 u[IU]/mL (ref 0.450–4.500)

## 2021-10-05 LAB — CARDIOVASCULAR RISK ASSESSMENT

## 2021-10-18 ENCOUNTER — Other Ambulatory Visit: Payer: Self-pay

## 2021-10-18 DIAGNOSIS — F900 Attention-deficit hyperactivity disorder, predominantly inattentive type: Secondary | ICD-10-CM

## 2021-10-18 MED ORDER — AMPHETAMINE-DEXTROAMPHETAMINE 30 MG PO TABS: 30.0000 mg | ORAL_TABLET | Freq: Two times a day (BID) | ORAL | 0 refills | Status: DC

## 2021-11-20 ENCOUNTER — Telehealth: Payer: Self-pay

## 2021-11-20 ENCOUNTER — Other Ambulatory Visit: Payer: Self-pay

## 2021-11-20 DIAGNOSIS — F900 Attention-deficit hyperactivity disorder, predominantly inattentive type: Secondary | ICD-10-CM

## 2021-11-20 DIAGNOSIS — M79604 Pain in right leg: Secondary | ICD-10-CM

## 2021-11-20 DIAGNOSIS — G8929 Other chronic pain: Secondary | ICD-10-CM

## 2021-11-20 MED ORDER — AMPHETAMINE-DEXTROAMPHETAMINE 30 MG PO TABS: 30.0000 mg | ORAL_TABLET | Freq: Two times a day (BID) | ORAL | 0 refills | Status: DC

## 2021-11-20 MED ORDER — HYDROXYCHLOROQUINE SULFATE 200 MG PO TABS: 400.0000 mg | ORAL_TABLET | Freq: Every day | ORAL | 1 refills | Status: DC

## 2021-11-20 NOTE — Telephone Encounter (Signed)
Patient called and stated she got her tooth pulled yesterday and her face is swollen and she is in pain, she called the dentist and they told her to call her PCP since she was already taking narcotics.   I explain to patient that she will be in pain for a couple days with getting a tooth pull and she should continue to keep taking tylenol and motrin as needed for pain and if the swelling is not down by Wednesday and pain is not better she should try to get in to be seen by dentist office.

## 2021-11-21 NOTE — Telephone Encounter (Signed)
Patient requesting medications be sent to different pharmacy as it is temporally closed.   Julia Humphrey 11/21/21 2:51 PM

## 2021-11-21 NOTE — Addendum Note (Signed)
Addended by: Humberto Leep on: 11/21/2021 02:51 PM   Modules accepted: Orders

## 2021-11-22 ENCOUNTER — Other Ambulatory Visit: Payer: Self-pay

## 2021-11-22 MED ORDER — HYDROXYCHLOROQUINE SULFATE 200 MG PO TABS: 400.0000 mg | ORAL_TABLET | Freq: Every day | ORAL | 1 refills | Status: AC

## 2021-11-22 MED ORDER — AMPHETAMINE-DEXTROAMPHETAMINE 30 MG PO TABS: 30.0000 mg | ORAL_TABLET | Freq: Two times a day (BID) | ORAL | 0 refills | Status: DC

## 2021-11-22 NOTE — Telephone Encounter (Signed)
Patient called about her pharmacy being closed and need to change her pharmacy.  Patient made aware that we have updated the change and she should hear from the pharmacy when they have her medication ready.

## 2021-11-26 ENCOUNTER — Telehealth: Payer: Self-pay

## 2021-11-26 NOTE — Telephone Encounter (Signed)
General/Other - medication management  Patient is not able to fill adderall 30 mg due to [employer]. She has contacted multiple pharmacies w/o success. She is requesting an alternative medication be sent during nationwide shortage.   Lorita Officer, CCMA 11/26/21 2:26 PM

## 2021-11-30 ENCOUNTER — Other Ambulatory Visit: Payer: Self-pay | Admitting: Nurse Practitioner

## 2021-11-30 DIAGNOSIS — F900 Attention-deficit hyperactivity disorder, predominantly inattentive type: Secondary | ICD-10-CM

## 2021-11-30 MED ORDER — LISDEXAMFETAMINE DIMESYLATE 40 MG PO CAPS: 40.0000 mg | ORAL_CAPSULE | ORAL | 0 refills | Status: DC

## 2021-11-30 NOTE — Telephone Encounter (Signed)
Left message for patient to return call.

## 2021-12-03 ENCOUNTER — Other Ambulatory Visit: Payer: Self-pay | Admitting: Nurse Practitioner

## 2021-12-03 DIAGNOSIS — F900 Attention-deficit hyperactivity disorder, predominantly inattentive type: Secondary | ICD-10-CM

## 2021-12-03 MED ORDER — AMPHETAMINE-DEXTROAMPHETAMINE 20 MG PO TABS: 20.0000 mg | ORAL_TABLET | Freq: Three times a day (TID) | ORAL | 0 refills | Status: AC

## 2021-12-10 ENCOUNTER — Encounter: Payer: Self-pay | Admitting: Nurse Practitioner

## 2021-12-10 ENCOUNTER — Ambulatory Visit (INDEPENDENT_AMBULATORY_CARE_PROVIDER_SITE_OTHER): Payer: 59 | Admitting: Nurse Practitioner

## 2021-12-10 ENCOUNTER — Other Ambulatory Visit: Payer: Self-pay

## 2021-12-10 VITALS — BP 152/102 | HR 103 | Temp 97.6°F | Ht 65.0 in | Wt 197.0 lb

## 2021-12-10 DIAGNOSIS — R079 Chest pain, unspecified: Secondary | ICD-10-CM

## 2021-12-10 DIAGNOSIS — H6501 Acute serous otitis media, right ear: Secondary | ICD-10-CM

## 2021-12-10 DIAGNOSIS — R030 Elevated blood-pressure reading, without diagnosis of hypertension: Secondary | ICD-10-CM

## 2021-12-10 MED ORDER — DOXYCYCLINE HYCLATE 100 MG PO TABS: 100.0000 mg | ORAL_TABLET | Freq: Two times a day (BID) | ORAL | 0 refills | Status: AC

## 2021-12-10 NOTE — Progress Notes (Signed)
? ?Acute Office Visit ? ?Subjective:  ? ? Patient ID: Julia Humphrey, female    DOB: 02-12-1986, 36 y.o.   MRN: [id number] ? ?Chief Complaint  ?Patient presents with  ? Ear Pain ?Chest pain  ? ? ?HPI: ?Julia Humphrey is a 36 year old Caucasian female that presents with chest pain. Onset of symptoms was one week ago. Describes pain as heavy, "someone sitting on my chest", with radiation down her left arm causing her hand to cramp. Pain is alleviated with rest. Denies nausea, dyspnea, or diaphoresis. Denies personal cardiac history. BP elevated today in-office 152/102.She is tearful and crying. Julia Humphrey does admit to having increased stress. She is a single mother, works 60+ hours weekly in [employer]. Past history of asthma, nicotine dependence with current cigarette smoking, ADHD, and chronic allergic rhinitis.  ? ?Julia Humphrey reports right ear pain. States she had inserted a cotton swab into right ear to alleviate itching, Q-tip had bloody drainage present. Denies previous treatment at home. States she wears air pods in one ear while at work.  ? ? ?Past Medical History:  ?Diagnosis Date  ? ADHD   ? Anxiety   ? Asthma   ? Depression   ? Rheumatoid arthritis ([place])   ? ? ?Past Surgical History:  ?Procedure Laterality Date  ? TUBAL LIGATION    ? ? ?Family History  ?Problem Relation Age of Onset  ? Rheum arthritis Mother   ? Hypertension Mother   ? Brain cancer Father   ? Hypertension Sister   ? Breast cancer Maternal Grandmother   ? ? ?Social History  ? ?Socioeconomic History  ? Marital status: Single  ?  Spouse name: Not on file  ? Number of children: 3  ? Years of education: Not on file  ? Highest education level: High school graduate  ?Occupational History  ? Not on file  ?Tobacco Use  ? Smoking status: Every Day  ?  Packs/day: 0.50  ?  Types: Cigarettes  ? Smokeless tobacco: Never  ? Tobacco comments:  ?  began smoking at age 36  ?Vaping Use  ? Vaping Use: Some days  ?Substance and Sexual Activity  ? Alcohol use: Yes   ?  Comment: occasional  ? Drug use: Not Currently  ?  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  ? Sexual activity: Not Currently  ?  Birth control/protection: Pill  ?Other Topics Concern  ? Not on file  ?Social History Narrative  ? Not on file  ? ?Social Determinants of Health  ? ?Financial Resource Strain: Not on file  ?Food Insecurity: Not on file  ?Transportation Needs: Not on file  ?Physical Activity: Not on file  ?Stress: Not on file  ?Social Connections: Not on file  ?Intimate Partner Violence: Not on file  ? ? ?Outpatient Medications Prior to Visit  ?Medication Sig Dispense Refill  ? albuterol (VENTOLIN HFA) 108 (90 Base) MCG/ACT inhaler Inhale 1-2 puffs into the lungs every 6 (six) hours as needed for wheezing or shortness of breath. 1 each 1  ? amphetamine-dextroamphetamine (ADDERALL) 20 MG tablet Take 1 tablet (20 mg total) by mouth in the morning, at noon, and at bedtime. 90 tablet 0  ? fluticasone (FLONASE) 50 MCG/ACT nasal spray Place 2 sprays into both nostrils daily. 16 g 6  ? Fluticasone-Umeclidin-Vilant (TRELEGY ELLIPTA) 200-62.5-25 MCG/INH AEPB Inhale 1 puff into the lungs daily. 2 each 0  ? hydroxychloroquine (PLAQUENIL) 200 MG tablet Take 2 tablets (400 mg total) by mouth daily. Take with  food or milk 180 tablet 1  ? hydrOXYzine (VISTARIL) 100 MG capsule Take 1 capsule (100 mg total) by mouth 3 (three) times daily as needed for itching. 30 capsule 0  ? promethazine-dextromethorphan (PROMETHAZINE-DM) 6.25-15 MG/5ML syrup Take 5 mLs by mouth 4 (four) times daily as needed. 118 mL 0  ? VITAMIN D PO Take by mouth.    ? ?No facility-administered medications prior to visit.  ? ? ?Allergies  ?Allergen Reactions  ? Amoxicillin Anaphylaxis  ? Penicillins Anaphylaxis  ? ? ?Review of Systems  ?Constitutional:  Negative for chills, fatigue and fever.  ?HENT:  Positive for ear discharge (bloody) and ear pain (right ear). Negative for congestion, rhinorrhea and sore throat.   ?Respiratory:   Negative for cough and shortness of breath.   ?Cardiovascular:  Positive for chest pain.  ?Gastrointestinal:  Negative for abdominal pain, constipation, diarrhea, nausea and vomiting.  ?Allergic/Immunologic: Positive for environmental allergies.  ?Neurological:  Negative for dizziness, weakness, light-headedness and headaches.  ?Psychiatric/Behavioral:  Negative for dysphoric mood. The patient is not nervous/anxious.   ? ?   ?Objective:  ?  ?Physical Exam ?Vitals reviewed.  ?HENT:  ?   Head: Normocephalic.  ?   Left Ear: Tympanic membrane normal.  ?   Nose: Nose normal.  ?   Mouth/Throat:  ?   Mouth: Mucous membranes are moist.  ?Eyes:  ?   Pupils: Pupils are equal, round, and reactive to light.  ?Cardiovascular:  ?   Rate and Rhythm: Normal rate and regular rhythm.  ?   Pulses: Normal pulses.  ?   Heart sounds: Normal heart sounds.  ?Pulmonary:  ?   Effort: Pulmonary effort is normal.  ?   Breath sounds: Normal breath sounds.  ?Abdominal:  ?   General: Bowel sounds are normal.  ?   Palpations: Abdomen is soft.  ?Musculoskeletal:     ?   General: Normal range of motion.  ?Skin: ?   General: Skin is warm and dry.  ?   Capillary Refill: Capillary refill takes less than 2 seconds.  ?Neurological:  ?   General: No focal deficit present.  ?   Mental Status: She is alert and oriented to person, place, and time.  ?Psychiatric:  ?   Comments: Tearful and crying  ? ? ?BP (!) 152/102   Pulse (!) 103   Temp 97.6 ?F (36.4 ?C)   Ht 5' 5"  (1.651 m)   Wt 197 lb (89.4 kg)   SpO2 99%   BMI 32.78 kg/m?   ?Wt Readings from Last 3 Encounters:  ?10/04/21 192 lb (87.1 kg)  ?08/20/21 193 lb (87.5 kg)  ?06/22/21 184 lb (83.5 kg)  ? ? ?Health Maintenance Due  ?Topic Date Due  ? TETANUS/TDAP  Never done  ? PAP SMEAR-Modifier  Never done  ? ? ? ? ? ?Lab Results  ?Component Value Date  ? TSH 2.530 10/04/2021  ? ?Lab Results  ?Component Value Date  ? WBC 5.2 10/04/2021  ? HGB 13.5 10/04/2021  ? HCT 40.3 10/04/2021  ? MCV 92 10/04/2021  ?  PLT 287 10/04/2021  ? ?Lab Results  ?Component Value Date  ? NA 138 10/04/2021  ? K 4.9 10/04/2021  ? CO2 24 10/04/2021  ? GLUCOSE 85 10/04/2021  ? BUN 12 10/04/2021  ? CREATININE 0.68 10/04/2021  ? BILITOT 0.3 10/04/2021  ? ALKPHOS 62 10/04/2021  ? AST 38 10/04/2021  ? ALT 44 (H) 10/04/2021  ? PROT 6.3 10/04/2021  ? ALBUMIN  4.2 10/04/2021  ? CALCIUM 8.5 (L) 10/04/2021  ? EGFR 116 10/04/2021  ? ?Lab Results  ?Component Value Date  ? CHOL 160 10/04/2021  ? ?Lab Results  ?Component Value Date  ? HDL 58 10/04/2021  ? ?Lab Results  ?Component Value Date  ? [place] 92 10/04/2021  ? ?Lab Results  ?Component Value Date  ? TRIG 44 10/04/2021  ? ?Lab Results  ?Component Value Date  ? CHOLHDL 2.8 10/04/2021  ? ? ? ?   ?Assessment & Plan:  ? ?1. Chest pain, unspecified type ?- Ambulatory referral to Cardiology ?- EKG 12-Lead ?-Troponin ?-CBC ?-CMP ? ?2. Elevated BP without diagnosis of hypertension ?- EKG 12-Lead ? ?3. Non-recurrent acute serous otitis media of right ear ?- doxycycline (VIBRA-TABS) 100 MG tablet; Take 1 tablet (100 mg total) by mouth 2 (two) times daily.  Dispense: 14 tablet; Refill: 0 ?  ? Pt experienced left chest pain while in-office today. She refused transport to hospital via EMS. Pt stated she needed to pick up her daughter from school. Pt states she will go to [facility] ED for evaluation after picking up her daughter. Pt was informed of risk of injury, possible death to her and her daughter if she drove vehicle while experiencing chest pain; pt stated she would accept risk and responsibility.  ? ?  ? ?Follow-up: PRN ? ?An After Visit Summary was printed and given to the patient. ? ?I, Rip Harbour, NP, have reviewed all documentation for this visit. The documentation on 12/10/21 for the exam, diagnosis, procedures, and orders are all accurate and complete.  ? ? ?Signed, ?Rip Harbour, NP ?[place] ?[phone]

## 2021-12-11 ENCOUNTER — Telehealth: Payer: Self-pay

## 2021-12-11 NOTE — Telephone Encounter (Signed)
Left message for patient to call our office. Per Carollee Herter, Troponin level was normal. Cardiology referral send. Will send Protonix 40 mg daily (will help symptoms if she has a ulcer) ?

## 2021-12-12 MED ORDER — PANTOPRAZOLE SODIUM 40 MG PO TBEC: 40.0000 mg | DELAYED_RELEASE_TABLET | Freq: Every day | ORAL | 0 refills | Status: AC

## 2021-12-12 NOTE — Telephone Encounter (Signed)
Patient returned call. She VU. Protonix 40 mg sent to [facility] in [place].  ? ?Lorita Officer, CCMA 12/12/21 11:06 AM ? ?

## 2021-12-12 NOTE — Addendum Note (Signed)
Addended by: Jamal Collin C on: 12/12/2021 11:06 AM ? ? Modules accepted: Orders ? ?

## 2021-12-24 ENCOUNTER — Telehealth: Payer: Self-pay

## 2021-12-24 ENCOUNTER — Other Ambulatory Visit: Payer: Self-pay | Admitting: Nurse Practitioner

## 2021-12-24 ENCOUNTER — Encounter: Payer: Self-pay | Admitting: Nurse Practitioner

## 2021-12-24 NOTE — Telephone Encounter (Signed)
Received call from [facility]) pharmacy requesting to verify if patient is on Vyvanse or Adderall and what dose. [place] staff member stated patient called this morning requesting Vyvanse to be filled. However, per PDMP Adderall 20 mg TID #90 was filled 12/03/21 at [facility] on [address] and Adderall 30 mg BID filled on 12/14/21 at [facility] on [address] that Vyvanse is not to be filled and to cancel the rx. Called [facility] to verify these fill dates for Adderall/dose/quantity which were correct. Jerrell Belfast, NP informed and will address. ?

## 2022-01-07 ENCOUNTER — Ambulatory Visit: Payer: 59 | Admitting: Nurse Practitioner

## 2022-01-15 ENCOUNTER — Ambulatory Visit: Payer: 59 | Admitting: Cardiology

## 2022-01-31 ENCOUNTER — Telehealth: Payer: Self-pay | Admitting: General Practice

## 2022-01-31 ENCOUNTER — Encounter: Payer: Self-pay | Admitting: General Practice

## 2022-01-31 NOTE — Telephone Encounter (Signed)
Left message on VM for patient to contact our office to schedule new patient appt. ?

## 2022-09-15 IMAGING — MG MM DIGITAL DIAGNOSTIC UNILAT*L* W/ TOMO W/ CAD
6 series · 6 of 18 positions shown · non-contrast
Comparison: Bilateral diagnostic mammogram and bilateral breast
ultrasound dated 11/10/2019 in [[email or web address]], [place].

CLINICAL DATA: Focal pain and bruising in the upper outer left
breast for the past 2 weeks, improving. No known injury. Family
history of breast cancer in a grandmother.

EXAM:
DIGITAL DIAGNOSTIC LEFT MAMMOGRAM WITH CAD AND TOMO
ULTRASOUND LEFT BREAST

[L CC synth-2D (1 of 2)]
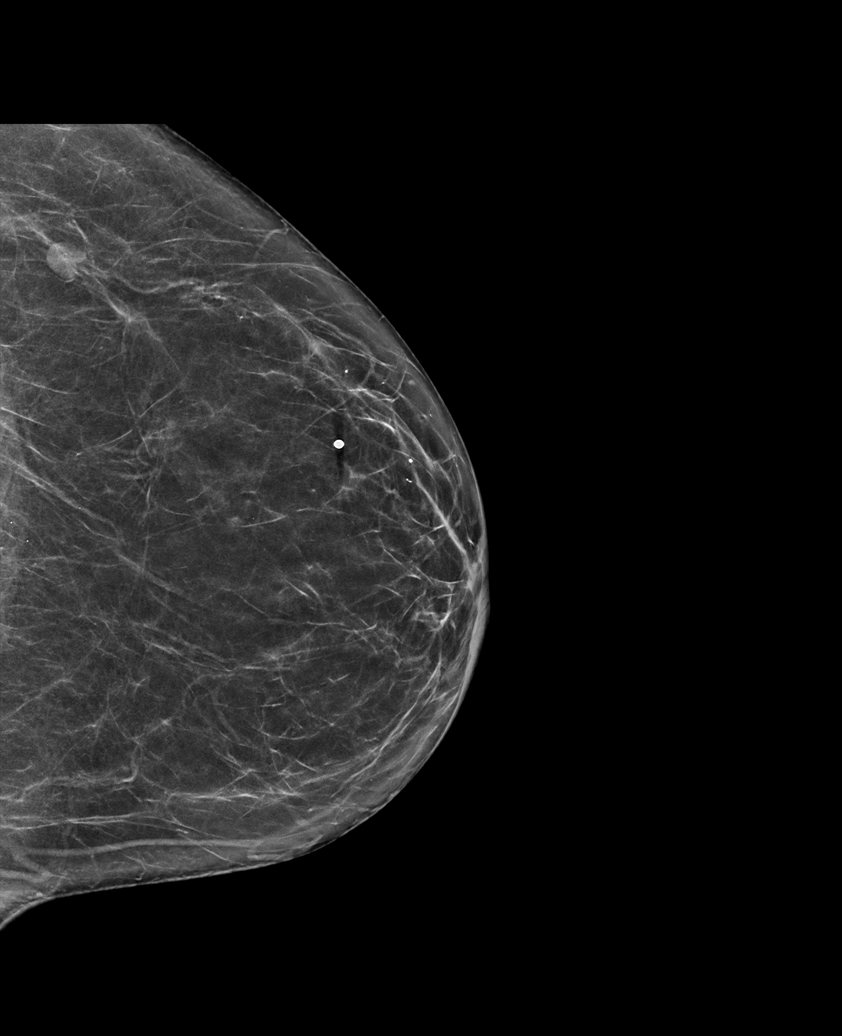

[L MLO synth-2D]
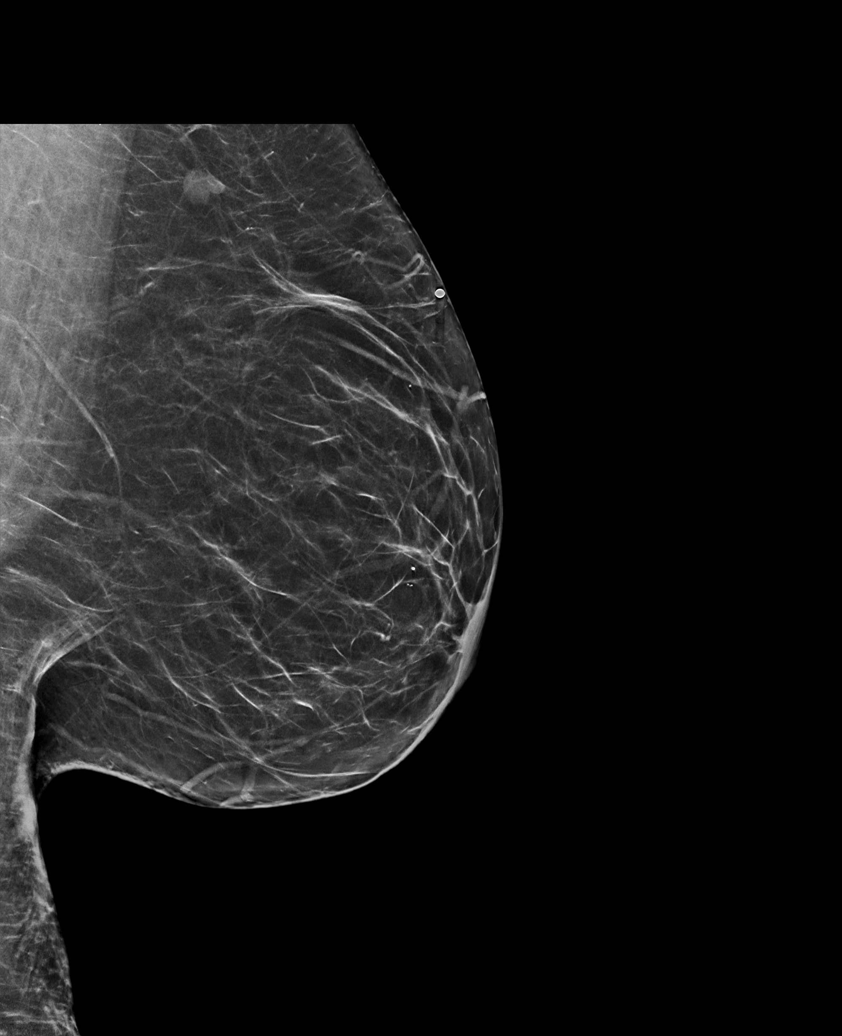

[L CC synth-2D (2 of 2)]
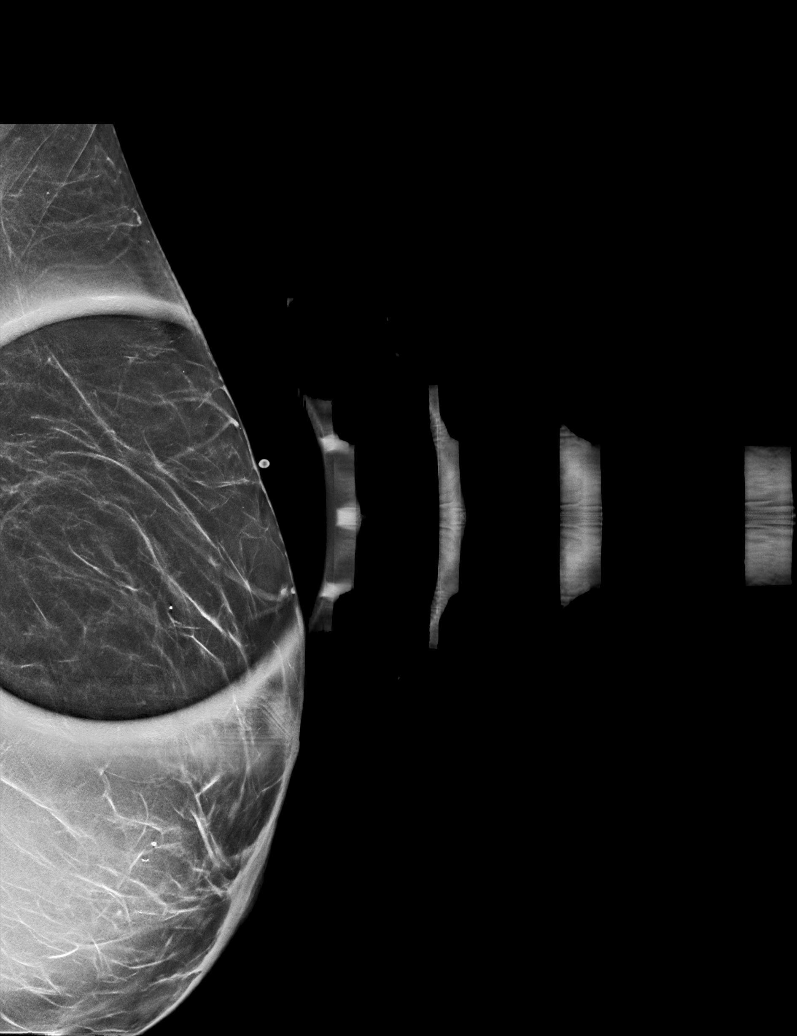

[L MLO tomo · tomo slice 36/71.0]
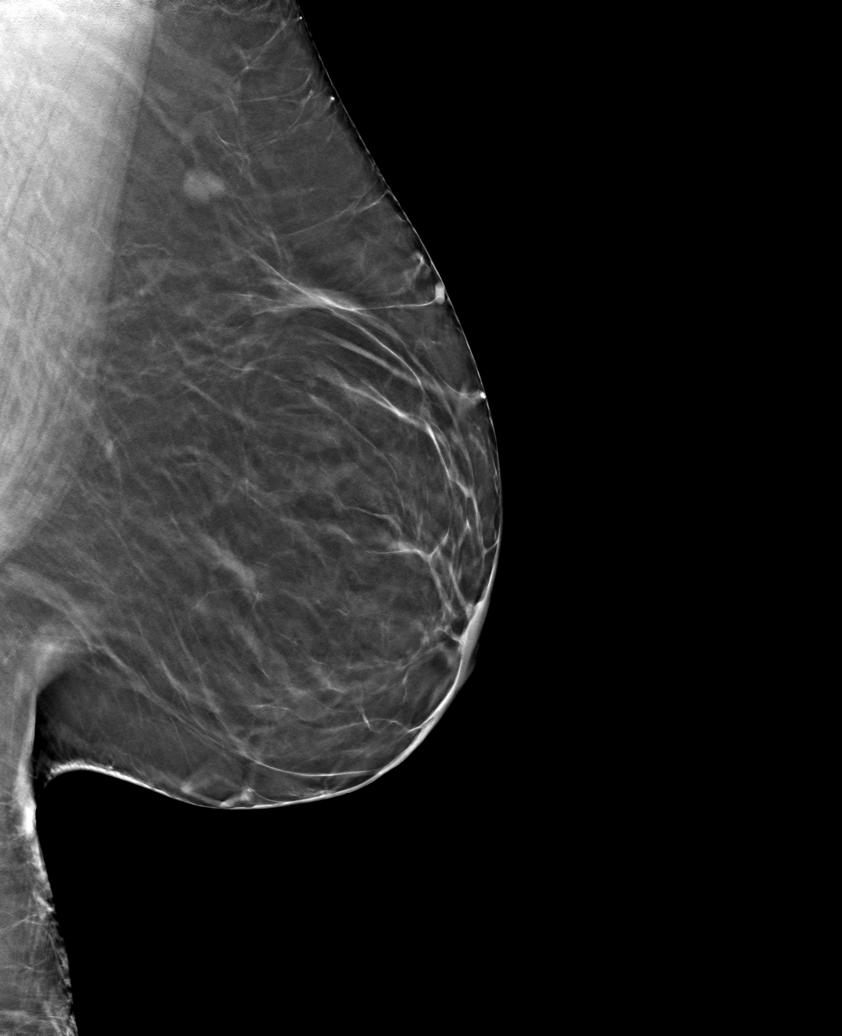

[L CC tomo (1 of 2) · tomo slice 33/66.0]
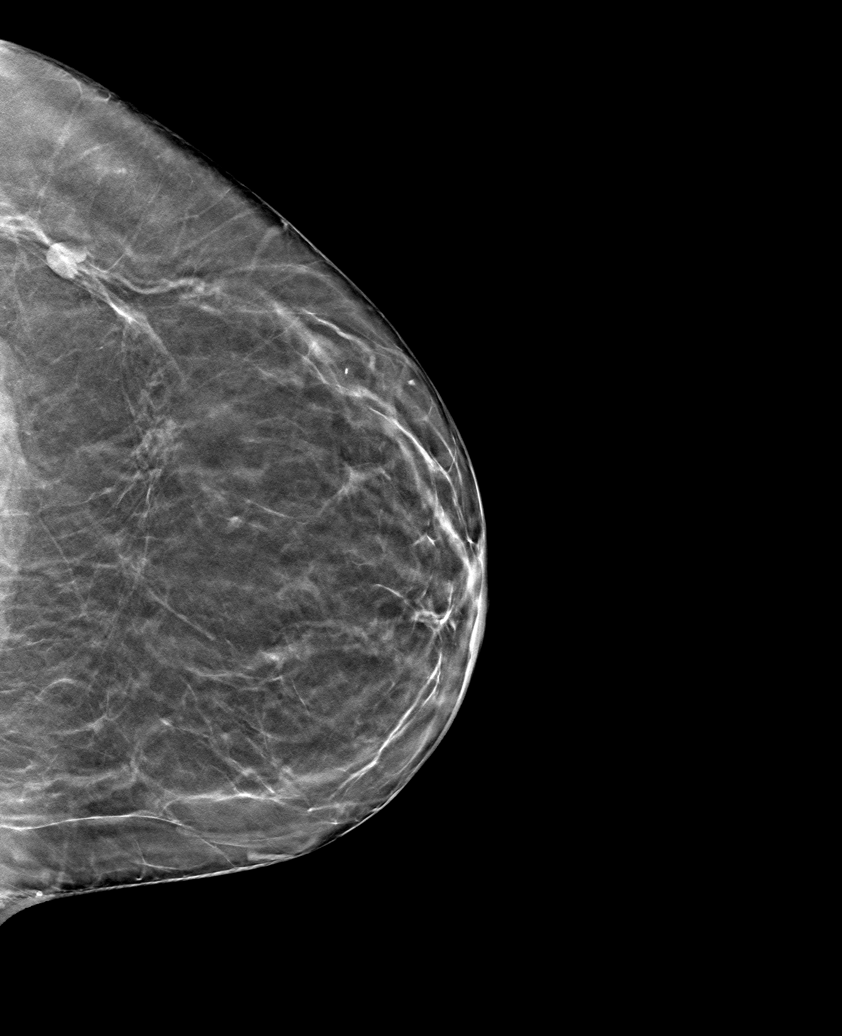

[L CC tomo (2 of 2) · tomo slice 31/60.0]
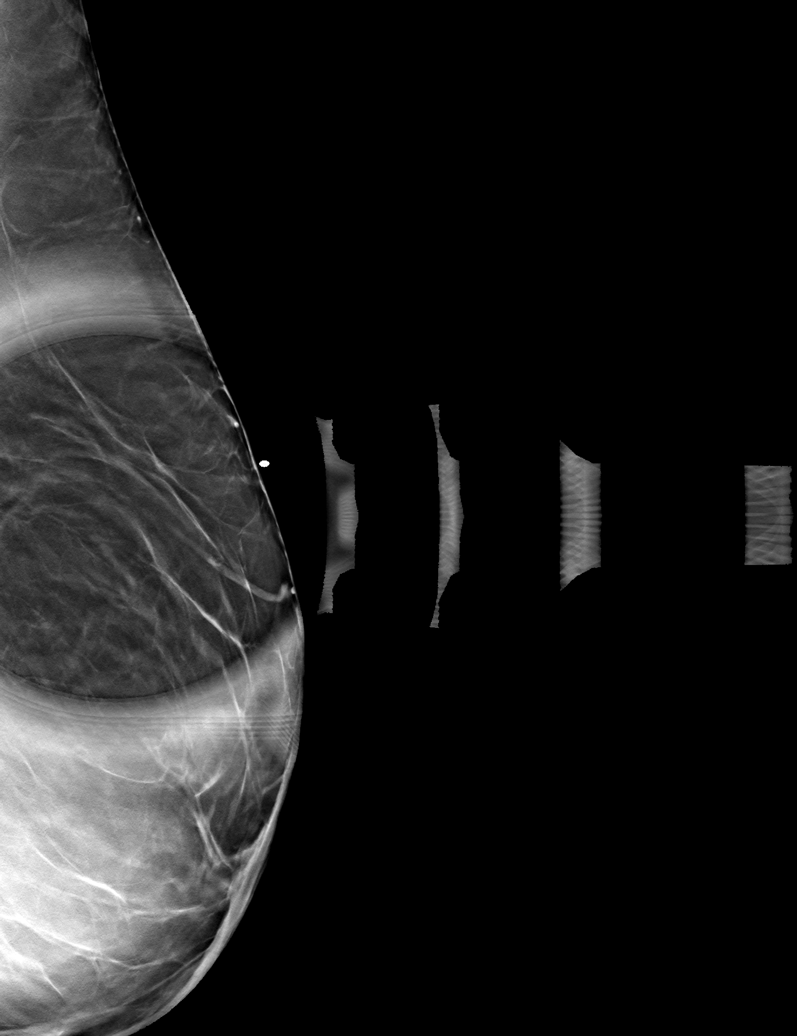

[6 of 18 positions shown; findings below may reference images not displayed]

ACR Breast Density Category b: There are scattered areas of
fibroglandular density.
FINDINGS: The previously demonstrated benign intramammary lymph node in the
posterior aspect of the upper-outer left breast is unchanged. No
interval findings suspicious for malignancy.

Mammographic images were processed with CAD.

On physical exam, the patient has an approximately 4 x 3 cm oval
area of slightly darker brown skin coloration in the upper outer
left breast at the location of focal pain and recent bruising. There
are palpable fat lobules in that area with no discrete palpable
mass. She is tender to palpation in that area.

Targeted ultrasound is performed, showing mild, ill-defined
increased echogenicity throughout the subcutaneous fat in the upper
outer left breast in the area of tenderness and resolving bruising.
No mass or fluid collection seen.
IMPRESSION: 1. Resolving bruising in the upper outer left breast.
2. No evidence of malignancy.

RECOMMENDATION:
Annual screening mammography beginning at age 40.

I have discussed the findings and recommendations with the patient.
If applicable, a reminder letter will be sent to the patient
regarding the next appointment.

BI-RADS CATEGORY  2: Benign.

## 2023-05-15 DIAGNOSIS — K279 Peptic ulcer, site unspecified, unspecified as acute or chronic, without hemorrhage or perforation: Secondary | ICD-10-CM | POA: Diagnosis not present

## 2023-05-15 DIAGNOSIS — R109 Unspecified abdominal pain: Secondary | ICD-10-CM | POA: Diagnosis not present

## 2023-05-15 DIAGNOSIS — K2970 Gastritis, unspecified, without bleeding: Secondary | ICD-10-CM | POA: Diagnosis not present
